# Patient Record
Sex: Female | Born: 1945
Health system: Southern US, Community
[De-identification: ages and names within clinical notes are randomized; demographics above are authoritative.]

## PROBLEM LIST (undated history)

## (undated) DIAGNOSIS — E119 Type 2 diabetes mellitus without complications: Secondary | ICD-10-CM

## (undated) DIAGNOSIS — E079 Disorder of thyroid, unspecified: Secondary | ICD-10-CM

## (undated) DIAGNOSIS — M8580 Other specified disorders of bone density and structure, unspecified site: Secondary | ICD-10-CM

## (undated) DIAGNOSIS — N200 Calculus of kidney: Secondary | ICD-10-CM

## (undated) DIAGNOSIS — K5790 Diverticulosis of intestine, part unspecified, without perforation or abscess without bleeding: Secondary | ICD-10-CM

## (undated) DIAGNOSIS — Z860100 Personal history of colon polyps, unspecified: Secondary | ICD-10-CM

## (undated) DIAGNOSIS — R05 Cough: Secondary | ICD-10-CM

## (undated) DIAGNOSIS — E039 Hypothyroidism, unspecified: Secondary | ICD-10-CM

## (undated) DIAGNOSIS — Z9889 Other specified postprocedural states: Secondary | ICD-10-CM

## (undated) DIAGNOSIS — R059 Cough, unspecified: Secondary | ICD-10-CM

## (undated) DIAGNOSIS — I10 Essential (primary) hypertension: Secondary | ICD-10-CM

## (undated) DIAGNOSIS — R112 Nausea with vomiting, unspecified: Secondary | ICD-10-CM

## (undated) DIAGNOSIS — R609 Edema, unspecified: Secondary | ICD-10-CM

## (undated) DIAGNOSIS — C44310 Basal cell carcinoma of skin of unspecified parts of face: Secondary | ICD-10-CM

## (undated) DIAGNOSIS — E559 Vitamin D deficiency, unspecified: Secondary | ICD-10-CM

## (undated) DIAGNOSIS — K8020 Calculus of gallbladder without cholecystitis without obstruction: Secondary | ICD-10-CM

## (undated) DIAGNOSIS — J449 Chronic obstructive pulmonary disease, unspecified: Secondary | ICD-10-CM

## (undated) DIAGNOSIS — E785 Hyperlipidemia, unspecified: Secondary | ICD-10-CM

## (undated) DIAGNOSIS — K219 Gastro-esophageal reflux disease without esophagitis: Secondary | ICD-10-CM

## (undated) HISTORY — DX: Gastro-esophageal reflux disease without esophagitis: K21.9

## (undated) HISTORY — DX: Vitamin D deficiency, unspecified: E55.9

## (undated) HISTORY — DX: Type 2 diabetes mellitus without complications: E11.9

## (undated) HISTORY — DX: Cough, unspecified: R05.9

## (undated) HISTORY — DX: Personal history of colonic polyps: Z86.010

## (undated) HISTORY — DX: Diverticulosis of intestine, part unspecified, without perforation or abscess without bleeding: K57.90

## (undated) HISTORY — PX: CATARACT EXTRACTION, BILATERAL: SHX1313

## (undated) HISTORY — DX: Disorder of thyroid, unspecified: E07.9

## (undated) HISTORY — PX: MOHS SURGERY: SUR867

## (undated) HISTORY — DX: Calculus of kidney: N20.0

## (undated) HISTORY — DX: Other specified disorders of bone density and structure, unspecified site: M85.80

## (undated) HISTORY — PX: VAGINAL HYSTERECTOMY: SUR661

## (undated) HISTORY — DX: Cough: R05

## (undated) HISTORY — DX: Edema, unspecified: R60.9

## (undated) HISTORY — DX: Basal cell carcinoma of skin of unspecified parts of face: C44.310

## (undated) HISTORY — DX: Hyperlipidemia, unspecified: E78.5

## (undated) HISTORY — PX: PARATHYROIDECTOMY: SHX19

## (undated) HISTORY — DX: Personal history of colon polyps, unspecified: Z86.0100

## (undated) HISTORY — DX: Calculus of gallbladder without cholecystitis without obstruction: K80.20

---

## 1999-11-11 ENCOUNTER — Encounter: Admission: RE | Admit: 1999-11-11 | Discharge: 1999-11-11 | Payer: Self-pay | Admitting: Family Medicine

## 1999-11-11 ENCOUNTER — Encounter: Payer: Self-pay | Admitting: Family Medicine

## 1999-11-17 ENCOUNTER — Ambulatory Visit (HOSPITAL_COMMUNITY): Admission: RE | Admit: 1999-11-17 | Discharge: 1999-11-17 | Payer: Self-pay

## 1999-12-07 ENCOUNTER — Ambulatory Visit (HOSPITAL_COMMUNITY): Admission: RE | Admit: 1999-12-07 | Discharge: 1999-12-07 | Payer: Self-pay

## 2000-09-06 ENCOUNTER — Encounter: Payer: Self-pay | Admitting: Family Medicine

## 2000-09-06 ENCOUNTER — Encounter: Admission: RE | Admit: 2000-09-06 | Discharge: 2000-09-06 | Payer: Self-pay | Admitting: Family Medicine

## 2001-06-06 ENCOUNTER — Emergency Department (HOSPITAL_COMMUNITY): Admission: EM | Admit: 2001-06-06 | Discharge: 2001-06-06 | Payer: Self-pay | Admitting: Emergency Medicine

## 2002-12-27 ENCOUNTER — Other Ambulatory Visit: Admission: RE | Admit: 2002-12-27 | Discharge: 2002-12-27 | Payer: Self-pay | Admitting: Family Medicine

## 2003-07-06 ENCOUNTER — Emergency Department (HOSPITAL_COMMUNITY): Admission: EM | Admit: 2003-07-06 | Discharge: 2003-07-07 | Payer: Self-pay | Admitting: Emergency Medicine

## 2004-02-04 ENCOUNTER — Encounter: Admission: RE | Admit: 2004-02-04 | Discharge: 2004-02-04 | Payer: Self-pay | Admitting: Family Medicine

## 2005-03-11 ENCOUNTER — Encounter: Admission: RE | Admit: 2005-03-11 | Discharge: 2005-03-11 | Payer: Self-pay | Admitting: Family Medicine

## 2006-03-21 ENCOUNTER — Encounter: Admission: RE | Admit: 2006-03-21 | Discharge: 2006-03-21 | Payer: Self-pay | Admitting: Family Medicine

## 2006-04-11 DIAGNOSIS — M8580 Other specified disorders of bone density and structure, unspecified site: Secondary | ICD-10-CM

## 2006-04-11 HISTORY — DX: Other specified disorders of bone density and structure, unspecified site: M85.80

## 2007-03-30 ENCOUNTER — Encounter: Admission: RE | Admit: 2007-03-30 | Discharge: 2007-03-30 | Payer: Self-pay | Admitting: Family Medicine

## 2007-04-27 ENCOUNTER — Ambulatory Visit: Payer: Self-pay | Admitting: Internal Medicine

## 2007-05-11 ENCOUNTER — Ambulatory Visit: Payer: Self-pay | Admitting: Internal Medicine

## 2007-05-11 ENCOUNTER — Encounter: Payer: Self-pay | Admitting: Internal Medicine

## 2008-04-16 ENCOUNTER — Other Ambulatory Visit: Admission: RE | Admit: 2008-04-16 | Discharge: 2008-04-16 | Payer: Self-pay | Admitting: Otolaryngology

## 2008-04-23 ENCOUNTER — Encounter: Admission: RE | Admit: 2008-04-23 | Discharge: 2008-04-23 | Payer: Self-pay | Admitting: Family Medicine

## 2008-04-29 ENCOUNTER — Encounter: Admission: RE | Admit: 2008-04-29 | Discharge: 2008-04-29 | Payer: Self-pay | Admitting: Family Medicine

## 2008-11-17 ENCOUNTER — Encounter: Admission: RE | Admit: 2008-11-17 | Discharge: 2008-11-17 | Payer: Self-pay | Admitting: Internal Medicine

## 2009-05-06 ENCOUNTER — Encounter: Admission: RE | Admit: 2009-05-06 | Discharge: 2009-05-06 | Payer: Self-pay | Admitting: Internal Medicine

## 2010-04-11 HISTORY — PX: CHOLECYSTECTOMY: SHX55

## 2010-05-27 ENCOUNTER — Encounter: Payer: Self-pay | Admitting: Internal Medicine

## 2010-06-02 NOTE — Letter (Signed)
Summary: Colonoscopy Letter  [facility]  [address] [place], [place] [address]   Phone: [phone]  Fax: [phone]      May 27, 2010 MRN: [id number]   VEE BAHE [address] [place], [place]  [address]   Dear Ms. LABRUM,   According to your medical record, it is time for you to schedule a Colonoscopy. The American Cancer Society recommends this procedure as a method to detect early colon cancer. Patients with a family history of colon cancer, or a personal history of colon polyps or inflammatory bowel disease are at increased risk.  This letter has been generated based on the recommendations made at the time of your procedure. If you feel that in your particular situation this may no longer apply, please contact our office.  Please call our office at [phone] to schedule this appointment or to update your records at your earliest convenience.  Thank you for cooperating with [place] to provide you with the very best care possible.   Sincerely,  Hedwig Morton. Juanda Chance, M.D.  [facility] [phone]

## 2010-06-18 ENCOUNTER — Emergency Department (HOSPITAL_COMMUNITY): Payer: Commercial Managed Care - PPO

## 2010-06-18 ENCOUNTER — Observation Stay (HOSPITAL_COMMUNITY)
Admission: EM | Admit: 2010-06-18 | Discharge: 2010-06-19 | Disposition: A | Discharge status: Home / Self Care | Payer: Commercial Managed Care - PPO | Attending: Internal Medicine | Admitting: Internal Medicine

## 2010-06-18 ENCOUNTER — Encounter (HOSPITAL_COMMUNITY): Payer: Self-pay | Admitting: Radiology

## 2010-06-18 DIAGNOSIS — E279 Disorder of adrenal gland, unspecified: Secondary | ICD-10-CM | POA: Insufficient documentation

## 2010-06-18 DIAGNOSIS — K7689 Other specified diseases of liver: Secondary | ICD-10-CM | POA: Insufficient documentation

## 2010-06-18 DIAGNOSIS — K219 Gastro-esophageal reflux disease without esophagitis: Principal | ICD-10-CM | POA: Insufficient documentation

## 2010-06-18 DIAGNOSIS — Z79899 Other long term (current) drug therapy: Secondary | ICD-10-CM | POA: Insufficient documentation

## 2010-06-18 DIAGNOSIS — J984 Other disorders of lung: Secondary | ICD-10-CM | POA: Insufficient documentation

## 2010-06-18 DIAGNOSIS — E039 Hypothyroidism, unspecified: Secondary | ICD-10-CM | POA: Insufficient documentation

## 2010-06-18 DIAGNOSIS — K8020 Calculus of gallbladder without cholecystitis without obstruction: Secondary | ICD-10-CM | POA: Insufficient documentation

## 2010-06-18 DIAGNOSIS — E214 Other specified disorders of parathyroid gland: Secondary | ICD-10-CM | POA: Insufficient documentation

## 2010-06-18 DIAGNOSIS — F17200 Nicotine dependence, unspecified, uncomplicated: Secondary | ICD-10-CM | POA: Insufficient documentation

## 2010-06-18 DIAGNOSIS — R072 Precordial pain: Secondary | ICD-10-CM

## 2010-06-18 DIAGNOSIS — I2510 Atherosclerotic heart disease of native coronary artery without angina pectoris: Secondary | ICD-10-CM | POA: Insufficient documentation

## 2010-06-18 DIAGNOSIS — I440 Atrioventricular block, first degree: Secondary | ICD-10-CM | POA: Insufficient documentation

## 2010-06-18 DIAGNOSIS — Z90710 Acquired absence of both cervix and uterus: Secondary | ICD-10-CM | POA: Insufficient documentation

## 2010-06-18 HISTORY — DX: Essential (primary) hypertension: I10

## 2010-06-18 LAB — DIFFERENTIAL
Basophils Absolute: 0 10*3/uL (ref 0.0–0.1)
Eosinophils Relative: 1 % (ref 0–5)
Lymphocytes Relative: 15 % (ref 12–46)
Lymphs Abs: 1.8 10*3/uL (ref 0.7–4.0)
Monocytes Absolute: 0.6 10*3/uL (ref 0.1–1.0)
Monocytes Relative: 5 % (ref 3–12)
Neutro Abs: 9.5 10*3/uL — ABNORMAL HIGH (ref 1.7–7.7)

## 2010-06-18 LAB — BASIC METABOLIC PANEL
BUN: 13 mg/dL (ref 6–23)
Chloride: 102 mEq/L (ref 96–112)
Creatinine, Ser: 1.01 mg/dL (ref 0.4–1.2)
GFR calc non Af Amer: 55 mL/min — ABNORMAL LOW (ref >60)
Glucose, Bld: 139 mg/dL — ABNORMAL HIGH (ref 70–99)

## 2010-06-18 LAB — CARDIAC PANEL(CRET KIN+CKTOT+MB+TROPI)
CK, MB: 1.3 ng/mL (ref 0.3–4.0)
CK, MB: 1.8 ng/mL (ref 0.3–4.0)
Total CK: 69 U/L (ref 7–177)
Total CK: 80 U/L (ref 7–177)
Troponin I: <0.01 ng/mL (ref 0.00–0.06)
Troponin I: 0.01 ng/mL (ref 0.00–0.06)

## 2010-06-18 LAB — POCT CARDIAC MARKERS
CKMB, poc: <1 ng/mL — ABNORMAL LOW (ref 1.0–8.0)
Troponin i, poc: <0.05 ng/mL (ref 0.00–0.09)

## 2010-06-18 LAB — CBC: Platelets: 242 10*3/uL (ref 150–400)

## 2010-06-18 MED ORDER — IOHEXOL 300 MG/ML  SOLN
80.0000 mL | Freq: Once | INTRAMUSCULAR | Status: AC | PRN
  Administered 2010-06-18: 80 mL via INTRAVENOUS

## 2010-06-19 LAB — CARDIAC PANEL(CRET KIN+CKTOT+MB+TROPI)
Relative Index: INVALID (ref 0.0–2.5)
Total CK: 87 U/L (ref 7–177)
Troponin I: 0.01 ng/mL (ref 0.00–0.06)

## 2010-06-30 NOTE — Discharge Summary (Signed)
NAME:  Erika Mason, Erika Mason            ACCOUNT NO.:  [id number]  MEDICAL RECORD NO.:  [id number]           PATIENT TYPE:  I  LOCATION:  1437                         FACILITY:  [facility]  PHYSICIAN:  Baltazar Najjar, MD     DATE OF BIRTH:  01-Jun-1945  DATE OF ADMISSION:  06/18/2010 DATE OF DISCHARGE:  06/19/2010                              DISCHARGE SUMMARY   FINAL DISCHARGE DIAGNOSES: 1. Atypical chest pain probably secondary to gastroesophageal reflux     disease. 2. Gastroesophageal reflux disease 3. Hypothyroidism. 4. Lung nodules. 5. Bilateral adrenal masses, probably adenomas 6. Incidental finding of gallstones with gallbladder thickening with     no evidence of cholecystitis clinically.  RADIOLOGY/IMAGING: 1. CT scan of the chest with contrast showed coronary artery     atherosclerosis, also incidentally showed 6-mm nodule in the     lingula with 4-mm nodule in the right lower lobe.  Recommendation     for repeat CT scan in 6-12 months recommended by Radiology.     Bilateral adrenal masses probably likely to be benign adenomas and     also they recommended noncontrast CT scan at the time of followup     for long nodules.  Gallstones with gallbladder wall thickening,     correlate clinically.  Hepatic steatosis. 2. A 2-D echocardiogram showed EF of 65-70%, vigorous left ventricular     systolic function and finding suggestive of grade 1 diastolic     dysfunction. 3. Chest x-ray showed mild hyperinflation, no acute finding.  BRIEF ADMITTING HISTORY:  Please refer to the H and P for more details. In summary, [name] is a 65 year old woman, heavy smoker, with history of GERD and the above history who presented to the ER with chest pain which she described as acid reflux on the right side of her chest similar to her previous episodes of GERD exacerbation, improving with GI cocktail in the ED, no change with nitroglycerin.  HOSPITAL COURSE:  The patient was admitted  to the telemetry floor.  Her troponins were cycled, were unremarkable.  Her symptoms improved with GI cocktail and increasing on her Protonix dose suggesting GI source rather than a cardiac source.  She had a 2-D echocardiogram as above and remained stable. 1. GERD.  PPI increased to twice daily.  I would recommend referral to     GI as an outpatient and defer that to her PCP. 2. Incidental finding of lung nodules.  CT scan was ordered because     the patient stated that she had 2 episodes of bloody sputum which     she described as small amount; however, given her chronic heavy     smoking history, CT scan was warranted.  Results as above.  She     will need repeat CT scan in 6 months, which we will defer to PCP. 3. Incidental finding of bilateral adrenal masses, probably adenomas.     Further workup as an outpatient per PCP and Radiology recommended     repeat noncontrast CT versus MRI to further characterize those     findings. 4. Tobacco abuse.  The patient was  counseled and placed on nicotine     patch, and I will give her prescription as well to go home with. 5. Hypothyroidism.  Continue with levothyroxine. 6. Gallbladder stones.  The patient is asymptomatic.  Follow up as an     outpatient.  The patient seen and examined by me today, and she is stable for discharge home.  DISCHARGE MEDICATIONS: 1. Aspirin 81 mg enteric coated p.o. daily. 2. Nicotine patch 21 mg daily. 3. Protonix 40 mg p.o. b.i.d. 4. Lipitor 40 mg p.o. daily at bedtime. 5. Hydrochlorothiazide 25 mg p.o. q.a.m. 6. Levothyroxine 88 mcg p.o. q.a.m. 7. Systane restorative formula 0.6% 2 drops in both eyes 4 times     daily.  DISCHARGE INSTRUCTIONS: 1. The patient to continue above medications as prescribed. 2. The patient to follow with her PCP preferably this coming next week     to discuss above findings and to arrange for followup for repeat     imaging in 6 months for her lung nodules and imaging for  her     adrenal glands as well. 3. The patient to continue with nicotine patch and counseled to quit     smoking. 4. The patient to report any worsening of her symptoms or any new     symptoms to her PCP, and I will defer decision to send her to GI     for EGD to her PCP if needed if her symptoms persist. 5. The patient is stable for discharge to home today.  CONDITION ON DISCHARGE:  Stable, improved.          ______________________________ Baltazar Najjar, MD     SA/MEDQ  D:  06/19/2010  T:  06/19/2010  Job:  [id number]  cc:   Tessa Lerner. Chelle, PA-C  Electronically Signed by Hannah Beat MD on 06/30/2010 07:43:19 PM

## 2010-06-30 NOTE — H&P (Signed)
NAME:  Erika Mason, Erika Mason NO.:  [id number]  MEDICAL RECORD NO.:  [id number]           PATIENT TYPE:  E  LOCATION:  WLED                         FACILITY:  [facility]  PHYSICIAN:  Baltazar Najjar, MD     DATE OF BIRTH:  [birth year]  DATE OF ADMISSION:  06/18/2010 DATE OF DISCHARGE:                             HISTORY & PHYSICAL   PRIMARY CARE PHYSICIAN:  Dr. Porfirio Oar at [facility].  CODE STATUS:  The patient is a full code.  CHIEF COMPLAINT:  Chest pain.  HISTORY OF PRESENT ILLNESS:  [name] is a 65 year old woman with a below-dictated medical history, presented to the ER this morning complaining of right-sided chest pain on the right side of her sternum, started early this morning.  She described her pain as burning pain, like acid pain as per her ward.  Denies any radiation.  She was able to pinpoint to the location of the pain on the right side of the sternum associated with vomiting.  She vomited twice this morning and felt clammy for few seconds.  As per her, she had similar pains for the last 7 years on and off; however, she had this pain 3 times within the last week.  She admitted that she had history of GERD and stated that her current symptoms are exactly the same as her previous care presentations.  The patient also has history of chronic cough which usually is productive of clear sputum.  However as per her last week, she had 2 episodes where she had bloody sputum and she described it as small amount of blood in her sputum.  She does smoke cigarettes for the last 15 years, about 15 cigarettes a day.  She denies any associated fever or shortness of breath.  PAST MEDICAL HISTORY: 1. Hypothyroidism. 2. GERD.  PAST SURGICAL HISTORY:  Hysterectomy, parathyroidectomy.  SOCIAL HISTORY:  Lives at home.  Her brother lives with her.  Smoke cigarettes, about 15 cigarettes a day for the last 15 years.  Denies EtOH  drinking or illicit drug use.  ALLERGIES:  No known drug allergies.  HOME MEDICATIONS: 1. Hydrochlorothiazide 25 mg daily which she states she takes for     fluid retention. 2. Levothyroxine 88 mcg daily. 3. Protonix 40 mg daily. 4. Atorvastatin 40 mg daily.  REVIEW OF SYSTEMS:  As stated above in HPI.  CHEST:  Complaining of cough with 2 episodes of blood in sputum.  CARDIOVASCULAR:  Chest pain, resolved with GI cocktail in the ED.  GI:  Vomiting resolved.  No abdominal pain.  No change in bowel habits.  GU:  No dysuria.  No flank pain or urinary frequency.  CONSTITUTIONAL:  No fever or chills.  PHYSICAL EXAMINATION:  VITAL SIGNS:  Blood pressure 125/68, pulse 82, temperature 97.7, O2 sat 98% on room air. GENERAL:  She is alert, not in distress. NECK:  Supple.  No JVD. LUNGS:  Clear to auscultation bilaterally. CARDIOVASCULAR:  S1, S2.  Regular rhythm and rate. ABDOMEN:  Soft, nontender. EXTREMITIES:  No pedal edema. NEUROLOGIC:  No focal deficits appreciated.  LABORATORY DATA:  Troponin less than  0.05, next set 0.05 x 2, potassium 3.7, BUN 15, creatinine 1.01, GFR 55.  WBC 12, hemoglobin 14, hematocrit 41, platelet count 242,000.  EKG showed normal sinus rhythm with no ST-T wave changes.  Chest x-ray showed mild hyperinflation.  No acute findings.  ASSESSMENT AND PLAN:  [name] is a 65 year old woman with no real cardiac risk factor other than being a smoker, presented to the ER with chest pain that was relieved by the GI cocktail in the ED.  No relief with nitro as per the ER notes.  Exactly the same to her previous ER presentation as per her ward.  Pain currently resolved. 1. Chest pain, sounds to be atypical, probably secondary to     gastroesophageal reflux disease.  We will admit the patient to     telemetry to cycle cardiac enzymes to rule out any possibility of     cardiac source which is unlikely to me. 2. Continue with proton pump inhibitor.  I will  increase her Protonix     to twice a day. 3. The patient will probably benefit from GI evaluation which can be     done as an outpatient for possible scope esophagogastroduodenoscopy     to rule out any esophagitis or any other possible GI pathology. 4. Will put her on a baby aspirin, enteric-coated, for now.  Will     order 2-D echocardiogram. 5. Tobacco abuse/bloody sputum.  The patient counseled extensively on     smoking cessation. 6. I will place her on nicotine patch. 7. I will order CT scan of her chest to rule out any tumors given the     fact that she is a chronically heavy smoker and the presence of 2     episodes of bloody sputum. 8. Hypothyroidism.  Continue with levothyroxine.  Check TSH. 9. GI and DVT prophylaxis.  Continue with her Protonix and will also     place her on heparin subcu. 10.Code status.  The patient is full code.          ______________________________ Baltazar Najjar, MD     SA/MEDQ  D:  06/18/2010  T:  06/18/2010  Job:  [id number]  cc:   Dr. Porfirio Oar at [facility]  Electronically Signed by Hannah Beat MD on 06/30/2010 07:43:46 PM

## 2010-08-11 ENCOUNTER — Ambulatory Visit
Admission: RE | Admit: 2010-08-11 | Discharge: 2010-08-11 | Disposition: A | Discharge status: Home / Self Care | Payer: Commercial Managed Care - PPO | Source: Ambulatory Visit | Attending: Family Medicine | Admitting: Family Medicine

## 2010-08-11 ENCOUNTER — Other Ambulatory Visit: Payer: Self-pay | Admitting: Family Medicine

## 2010-08-11 DIAGNOSIS — R1011 Right upper quadrant pain: Secondary | ICD-10-CM

## 2010-08-11 DIAGNOSIS — R112 Nausea with vomiting, unspecified: Secondary | ICD-10-CM

## 2010-08-13 ENCOUNTER — Telehealth: Payer: Self-pay | Admitting: Internal Medicine

## 2010-08-13 DIAGNOSIS — Z1211 Encounter for screening for malignant neoplasm of colon: Secondary | ICD-10-CM

## 2010-08-13 NOTE — Telephone Encounter (Signed)
Pt states that she would like to schedule an endo and colon. She is due for a colon but pt states she is having problems with reflux. Pt wants to make sure that the acid has not damaged her esophagus. Dr. Juanda Chance does she need direct procedures scheduled or would she need an OV first? Please advise.

## 2010-08-14 NOTE — Telephone Encounter (Signed)
Chart reviewed, please, schedule direct EGD/colon

## 2010-08-16 NOTE — Telephone Encounter (Signed)
Scheduled pt for procedures, have left message for pt to call back.

## 2010-08-16 NOTE — Telephone Encounter (Signed)
Pt scheduled for previsit for 08/23/10@4 :30pm and endo/colon 09/03/10@3 :30pm. Pt aware of appt dates and times.

## 2010-08-23 ENCOUNTER — Ambulatory Visit (AMBULATORY_SURGERY_CENTER): Payer: Commercial Managed Care - PPO | Admitting: *Deleted

## 2010-08-23 VITALS — Ht 63.0 in | Wt 158.2 lb

## 2010-08-23 DIAGNOSIS — Z860100 Personal history of colon polyps, unspecified: Secondary | ICD-10-CM

## 2010-08-23 DIAGNOSIS — K219 Gastro-esophageal reflux disease without esophagitis: Secondary | ICD-10-CM

## 2010-08-23 MED ORDER — PEG-KCL-NACL-NASULF-NA ASC-C 100 G PO SOLR: ORAL | Status: DC

## 2010-08-27 NOTE — Op Note (Signed)
[facility]  Patient:    Erika Mason, Erika Mason                   MRN: [id number] Proc. Date: 12/07/99 Adm. Date:  [id number] Attending:  Gennie Alma CC:         Quita Skye. Artis Flock, M.D.   Operative Report  [facility] [place] NUMBER:  [id number]  PREOPERATIVE DIAGNOSIS:  Parathyroid adenoma of left inferior gland.  POSTOPERATIVE DIAGNOSIS:  Parathyroid adenoma of left inferior gland.  OPERATION:  Minimally invasive radio-guided parathyroid adenoma resection.  SURGEON:  Milus Mallick, M.D.  ASSISTANT:  Adolph Pollack, M.D.  ANESTHESIA:  General endotracheal.  DESCRIPTION OF PROCEDURE:  Under adequate general endotracheal anesthesia, the patients neck was extended over a shoulder roll and then prepared and draped in the usual fashion.  Nuclear mapping of the neck revealed a very hot area in the left inferior position.  The patient had received technetium sestamibi agent intravenously approximately 90 minutes prior to surgery.  She had a strongly positive sestamibi scan that revealed a probable parathyroid adenoma of the left inferior gland.  A transverse small incision was made overlying the area of increased radioactivity.  The incision was carried down through the subcutaneous tissue and the platysma muscle.  Soft platysmal flaps were dissected.  The anterior jugular vein was encountered and divided over hemostats, which were ligated with 4-0 silk.  The strap muscles were then divided and the surgical plane of the thyroid was entered.  The anterior pole of the thyroid was just under the incision.  It had increased counts of radioactivity, but it was thought that they were probably coming from tissue placed nearer to the thyroid lobe.  The left thyroid lobe was retracted medially and anteriorly and almost immediately a large parathyroid adenoma was begun to be seen.  It was adjacent to the left carotid artery and extending down into the  anterior superior mediastinum.  The probe confirmed that this was the area of highest activity.  The adenoma was then dissected out by blunt and sharp dissection.  Its blood supply was divided over small hemoclips. Most of the dissection was done with mosquito hemostats and Kitner dissectors. The entire lesion was freed up and completely excised.  The ex vivo counts were in the range of 1400 counts/sec with background being 400-500.  It was measured and found to be 2 cm in length, 1.2 cm in width, and 0.8 cm in thickness.  It was sent for routine pathologic study.  The counts of radioactivity in the four quadrants now equalized at around 400 counts/sec.  Hemostasis was ascertained.  The strap muscles were repaired with interrupted sutures of 4-0 Vicryl.  The platysma was reapproximated with interrupted sutures of 4-0 Vicryl.  The subcuticular layer was reapproximated with a continuous suture of 5-0 Vicryl and then Steri-Strips were applied to the skin.  A sterile dressing was applied.  The estimated blood loss for the procedure was negligible.  The patient tolerated the procedure well and left the operating room in satisfactory condition. DD:  12/07/99 TD:  12/07/99 Job: [id number]

## 2010-09-03 ENCOUNTER — Encounter: Payer: Self-pay | Admitting: Internal Medicine

## 2010-09-03 ENCOUNTER — Ambulatory Visit (AMBULATORY_SURGERY_CENTER): Payer: Commercial Managed Care - PPO | Admitting: Internal Medicine

## 2010-09-03 VITALS — BP 123/63 | HR 86 | Temp 98.0°F | Resp 18 | Ht 63.0 in | Wt 158.3 lb

## 2010-09-03 DIAGNOSIS — K219 Gastro-esophageal reflux disease without esophagitis: Secondary | ICD-10-CM

## 2010-09-03 DIAGNOSIS — D126 Benign neoplasm of colon, unspecified: Secondary | ICD-10-CM

## 2010-09-03 DIAGNOSIS — K2270 Barrett's esophagus without dysplasia: Secondary | ICD-10-CM

## 2010-09-03 DIAGNOSIS — Z1211 Encounter for screening for malignant neoplasm of colon: Secondary | ICD-10-CM

## 2010-09-03 DIAGNOSIS — Z86010 Personal history of colonic polyps: Secondary | ICD-10-CM

## 2010-09-03 MED ORDER — PANTOPRAZOLE SODIUM 40 MG PO TBEC: 40.0000 mg | DELAYED_RELEASE_TABLET | Freq: Two times a day (BID) | ORAL | Status: DC

## 2010-09-03 MED ORDER — SODIUM CHLORIDE 0.9 % IV SOLN: 500.0000 mL | INTRAVENOUS | Status: DC

## 2010-09-03 NOTE — Patient Instructions (Addendum)
PLEASE FOLLOW DISCHARGE INSTRUCTIONS GIVEN TODAY. RESULT LETTER WILL BE MAILED TO YOU IN 1-2 WEEKS. CALL us WITH ANY QUESTIONS OR CONCERNS. WE WILL CALL YOU Tuesday MORNING TO CHECK ON YOU. RESUME YOUR CURRENT MEDICATIONS. REPEAT COLONOSCOPY IN 3-5 YEARS. NO ASPIRIN FOR 2 WEEKS! TRY TO FOLLOW HIGH FIBER DIET,SEE HANDOUT GIVEN. TAKE PROTONIX  TWICE A DAY.

## 2010-09-07 ENCOUNTER — Telehealth: Payer: Self-pay | Admitting: *Deleted

## 2010-09-07 NOTE — Telephone Encounter (Signed)

## 2010-09-14 ENCOUNTER — Encounter: Payer: Self-pay | Admitting: Internal Medicine

## 2010-10-04 ENCOUNTER — Telehealth (INDEPENDENT_AMBULATORY_CARE_PROVIDER_SITE_OTHER): Payer: Self-pay | Admitting: General Surgery

## 2010-10-04 NOTE — Telephone Encounter (Signed)
Called pt and gave diet instructions for patient with gallstones.

## 2010-10-11 ENCOUNTER — Ambulatory Visit (INDEPENDENT_AMBULATORY_CARE_PROVIDER_SITE_OTHER): Payer: Self-pay | Admitting: General Surgery

## 2010-10-19 ENCOUNTER — Ambulatory Visit (INDEPENDENT_AMBULATORY_CARE_PROVIDER_SITE_OTHER): Payer: Commercial Managed Care - PPO | Admitting: General Surgery

## 2010-10-19 ENCOUNTER — Encounter (INDEPENDENT_AMBULATORY_CARE_PROVIDER_SITE_OTHER): Payer: Self-pay | Admitting: General Surgery

## 2010-10-19 VITALS — BP 122/78 | HR 95 | Ht 63.5 in | Wt 156.6 lb

## 2010-10-19 DIAGNOSIS — K8020 Calculus of gallbladder without cholecystitis without obstruction: Secondary | ICD-10-CM

## 2010-10-19 NOTE — Progress Notes (Signed)
Subjective:     Patient ID: Erika Mason, female   DOB: 09-27-1945, 65 y.o.   MRN: [id number]    BP 122/78  Pulse 95  Ht 5' 3.5" (1.613 m)  Wt 156 lb 9.6 oz (71.033 kg)  BMI 27.31 kg/m2    HPI This patient was referred for evaluation of right quadrant abdominal pain and cholelithiasis discovered on ultrasound of the abdomen performed in May of 2012. She has had a long standing history of abdominal pain which she first noticed approximately 5-6 years ago and her symptoms occur approximately once per year over this time period. However, they have become more frequent over the last year occurring up to 2 times per week but she states that they have improved since she has been "watching what she eats". She denies any specific fatty food association although she does notice this were frequently with spaghetti and her most recent attack was after eating a hot dog. She describes the pain as originating in the right upper quadrant and radiates around to the back and she states it feels like "pressure" squeezing her from the front to back. The episodes occur most commonly in the morning at about 3:30 in the morning which awakened her from sleep the last several hours. She has associated nausea and occasional vomiting which will often relieve her symptoms. She was recently seen in the emergency room for the symptoms in May 2012 and had ultrasound obtained which demonstrated cholelithiasis with some gallbladder wall thickening but no other evidence of acute cholecystitis common bile duct was 4.5 mm. She also has had an EGD recently which demonstrated Barrett esophagus but no evidence of masses or ulcers she has also had a colonoscopy which was negative for any significant disease. Pathology was consistent with hyperplastic polyps in her colon. She was told to followup in 2 years for evaluation of her Barrett's esophagus by her GI physician. She denies any jaundice or fevers.  No Known Allergies  Past  Medical History  Diagnosis Date  . Hypertension   . Hyperlipidemia   . Thyroid disease   . GERD (gastroesophageal reflux disease)   . Cough    Past Surgical History  Procedure Date  . Vaginal hysterectomy   . Parathyroidectomy   . Cataract extraction, bilateral     Current outpatient prescriptions:atorvastatin (LIPITOR) 40 MG tablet, Take 40 mg by mouth daily. , Disp: , Rfl: ;  hydrochlorothiazide 25 MG tablet, Take 25 mg by mouth daily.  , Disp: , Rfl: ;  levothyroxine (SYNTHROID, LEVOTHROID) 88 MCG tablet, Take 88 mcg by mouth daily.  , Disp: , Rfl: ;  pantoprazole (PROTONIX) 40 MG tablet, Take 1 tablet (40 mg total) by mouth 2 (two) times daily., Disp: 180 tablet, Rfl: 3 amLODipine-atorvastatin (CADUET) 5-40 MG per tablet, Take 1 tablet by mouth daily.  , Disp: , Rfl: ;  DISCONTD: pantoprazole (PROTONIX) 40 MG tablet, Take 40 mg by mouth daily.  , Disp: , Rfl:  Current facility-administered medications:0.9 %  sodium chloride infusion, 500 mL, Intravenous, Continuous, Hart Carwin, MD History   Social History  . Marital Status: Single    Spouse Name: N/A    Number of Children: N/A  . Years of Education: N/A   Social History Main Topics  . Smoking status: Current Everyday Smoker -- 0.8 packs/day    Types: Cigarettes  . Smokeless tobacco: None  . Alcohol Use: No  . Drug Use: No  . Sexually Active: None   Other Topics  Concern  . None   Social History Narrative  . None     Review of Systems  Constitutional: Negative.   HENT: Positive for hearing loss.   Eyes: Negative.   Respiratory: Positive for cough.   Cardiovascular: Negative.   Gastrointestinal: Positive for nausea, vomiting and abdominal pain.  Genitourinary: Negative.   Musculoskeletal: Negative.   Skin: Negative.   Neurological: Negative.   Hematological: Negative.   Psychiatric/Behavioral: Negative.   All other systems reviewed and are negative.        Objective:   Physical Exam  Constitutional:  She is oriented to person, place, and time. She appears well-developed and well-nourished. No distress.  HENT:  Head: Normocephalic and atraumatic.  Mouth/Throat: No oropharyngeal exudate.  Eyes: Conjunctivae are normal. Pupils are equal, round, and reactive to light. Right eye exhibits no discharge. Left eye exhibits no discharge. No scleral icterus.  Neck: Normal range of motion. Neck supple. No tracheal deviation present.  Cardiovascular: Normal rate, regular rhythm, normal heart sounds and intact distal pulses.   Pulmonary/Chest: Effort normal and breath sounds normal. No stridor. No respiratory distress. She has no wheezes. She has no rales.  Abdominal: Soft. Bowel sounds are normal. She exhibits no distension and no mass. There is no tenderness. There is no rebound and no guarding.  Musculoskeletal: Normal range of motion. She exhibits no edema.  Neurological: She is alert and oriented to person, place, and time. She has normal reflexes.  Skin: Skin is warm and dry. No rash noted. She is not diaphoretic. No erythema.  Psychiatric: She has a normal mood and affect. Her behavior is normal. Judgment and thought content normal.       Assessment:     Symptomatic cholelithiasis    Plan:     I agree that this is most likely due to symptomatic cholelithiasis. She has had long-standing history of abdominal pain and given her history which is fairly classic for symptomatic gallstones as well as an ultrasound consistent with cholelithiasis I have recommended cholecystectomy for possible relief of her symptoms. She has also had upper and lower endoscopy and is currently on PPIs for reflux disease and this has not modified her symptoms. I discussed with her the alternatives of continued observation versus cholecystectomy including laparoscopic and open procedure and she desires to proceed with appendectomy cholecystectomy. We discussed the risks of infection, bleeding, pain, scarring, persistent  symptoms, bowel injury, injury to common bile duct, retained stone, and need for open surgery and she expressed understanding. We will plan for lap appendectomy cholecystectomy at the next available appointment.

## 2010-10-28 ENCOUNTER — Encounter (HOSPITAL_COMMUNITY): Payer: Commercial Managed Care - PPO

## 2010-10-28 ENCOUNTER — Other Ambulatory Visit (INDEPENDENT_AMBULATORY_CARE_PROVIDER_SITE_OTHER): Payer: Self-pay | Admitting: General Surgery

## 2010-10-28 LAB — CBC
Hemoglobin: 14.6 g/dL (ref 12.0–15.0)
MCH: 30.9 pg (ref 26.0–34.0)
MCHC: 34 g/dL (ref 30.0–36.0)
MCV: 90.7 fL (ref 78.0–100.0)

## 2010-10-28 LAB — COMPREHENSIVE METABOLIC PANEL
ALT: 23 U/L (ref 0–35)
AST: 17 U/L (ref 0–37)
Albumin: 4.1 g/dL (ref 3.5–5.2)
Alkaline Phosphatase: 115 U/L (ref 39–117)
Chloride: 101 mEq/L (ref 96–112)
Creatinine, Ser: 0.84 mg/dL (ref 0.50–1.10)
Potassium: 3.8 mEq/L (ref 3.5–5.1)
Sodium: 138 mEq/L (ref 135–145)
Total Bilirubin: 0.4 mg/dL (ref 0.3–1.2)

## 2010-10-28 LAB — SURGICAL PCR SCREEN: Staphylococcus aureus: NEGATIVE

## 2010-10-28 LAB — APTT: aPTT: 32 seconds (ref 24–37)

## 2010-10-28 LAB — PROTIME-INR: Prothrombin Time: 12.3 seconds (ref 11.6–15.2)

## 2010-11-04 ENCOUNTER — Other Ambulatory Visit (INDEPENDENT_AMBULATORY_CARE_PROVIDER_SITE_OTHER): Payer: Self-pay | Admitting: General Surgery

## 2010-11-04 ENCOUNTER — Ambulatory Visit (HOSPITAL_COMMUNITY)
Admission: RE | Admit: 2010-11-04 | Discharge: 2010-11-04 | Disposition: A | Discharge status: Home / Self Care | Payer: Commercial Managed Care - PPO | Source: Ambulatory Visit | Attending: General Surgery | Admitting: General Surgery

## 2010-11-04 ENCOUNTER — Ambulatory Visit (HOSPITAL_COMMUNITY): Payer: Commercial Managed Care - PPO

## 2010-11-04 DIAGNOSIS — E785 Hyperlipidemia, unspecified: Secondary | ICD-10-CM | POA: Insufficient documentation

## 2010-11-04 DIAGNOSIS — Z01810 Encounter for preprocedural cardiovascular examination: Secondary | ICD-10-CM | POA: Insufficient documentation

## 2010-11-04 DIAGNOSIS — F17200 Nicotine dependence, unspecified, uncomplicated: Secondary | ICD-10-CM | POA: Insufficient documentation

## 2010-11-04 DIAGNOSIS — K8010 Calculus of gallbladder with chronic cholecystitis without obstruction: Secondary | ICD-10-CM | POA: Insufficient documentation

## 2010-11-04 DIAGNOSIS — Z01812 Encounter for preprocedural laboratory examination: Secondary | ICD-10-CM | POA: Insufficient documentation

## 2010-11-04 DIAGNOSIS — Z79899 Other long term (current) drug therapy: Secondary | ICD-10-CM | POA: Insufficient documentation

## 2010-11-04 DIAGNOSIS — I10 Essential (primary) hypertension: Secondary | ICD-10-CM | POA: Insufficient documentation

## 2010-11-04 DIAGNOSIS — K219 Gastro-esophageal reflux disease without esophagitis: Secondary | ICD-10-CM | POA: Insufficient documentation

## 2010-11-20 NOTE — Op Note (Signed)
NAME:  Erika Mason, JERRY NO.:  [id number]  MEDICAL RECORD NO.:  [id number]  LOCATION:  DAYL                         FACILITY:  [facility]  PHYSICIAN:  Lodema Pilot, MD       DATE OF BIRTH:  [birth year]  DATE OF PROCEDURE:  11/04/2010 DATE OF DISCHARGE:  11/04/2010                              OPERATIVE REPORT   PROCEDURE:  Laparoscopic cholecystectomy with intraoperative cholangiogram.  PREOPERATIVE DIAGNOSIS:  Symptomatic cholelithiasis.  POSTOPERATIVE DIAGNOSIS:  Symptomatic cholelithiasis.  SURGEON:  Lodema Pilot, MD  ASSISTANT:  Anselm Pancoast. Zachery Dakins, MD  ANESTHESIA:  General endotracheal anesthesia with 30 cc of 1% lidocaine with epinephrine and 0.25% Marcaine injected in 50:50 mixture.  FLUIDS:  1800 cc of crystalloid.  ESTIMATED BLOOD LOSS:  Minimal.  DRAINS:  None.  SPECIMENS:  Gallbladder and contents sent to Pathology for permanent sectioning.  COMPLICATIONS:  None apparent.  FINDINGS:  Small contracted gallbladder with 2 moderate size pigment stones and some sludge.  One of the stones was extracted from the distal cystic duct occluding the cystic duct and preventing cholangiogram. Again, this was extracted.  She had a dilated cystic duct, it was clipped and Endoloop placed.  Normal cholangiogram after the stone extraction with normal filling of the right and left hepatic ducts, common duct without filling defect and free flow of contrast into the duodenum.  INDICATION FOR PROCEDURE:  Erika Mason is a 65 year old female with signs and symptoms consistent with symptomatic cholelithiasis.  She had an ultrasound demonstrating cholelithiasis.  LFTs were normal and normal physical exam.  Desired cholecystectomy for relief of her abdominal pain.  OPERATIVE DETAILS:  Erika Mason was seen and evaluated in the preoperative area and risks and benefits of the procedure were again discussed in lay terms and informed consent was obtained.  She was  taken to the operating room and placed on the table in supine position and prophylactic antibiotics were given.  General endotracheal anesthesia was obtained and procedure time-out was performed with all other team members to confirm proper patient and procedure.  A Foley catheter was placed and her abdomen was prepped and draped in a standard surgical fashion.  A semicircular infraumbilical incision was made in the skin and dissection carried down through the subcutaneous tissues using blunt dissection.  The abdominal wall fascia was elevated and sharply incised to the peritoneum under direct visualization.  A 12 mm balloon trocar was placed into the umbilicus and pneumoperitoneum was obtained.  After scope was introduced and there was no evidence of bowel injury upon entry, 11 mm epigastric trocar and two 5 mm right upper quadrant trocars were placed, all under direct visualization.  The gallbladder was retracted cephalad.  She had some omental adhesions to the gallbladder and to the liver, which were taken with sharp dissection.  Her gallbladder wall was thickened and contracted, consistent with a chronic cholecystitis.  Peritoneum was taken down using blunt dissection and the node of Calot was identified.  This was dissected from the gallbladder and taken down medially and the cystic duct was identified and skeletonized.  The critical view of safety was obtained by further dissecting the triangle of Calot, visualized the single cystic duct coursing  to the gallbladder and liver parenchyma visualized through the triangle of Calot and a single cystic artery.  Cystic artery was clipped and transected, which further opened the triangle of Calot and again, visualizing a single cystic duct coursing up on to the gallbladder.  The cystic duct artery was fairly wide and a cholangiogram was performed.  A clip was placed on the gallbladder and cystic duct junction and a small cystic ductotomy was  made, cholangiogram catheter was placed through the abdominal wall through a 14-gauge angiocatheter and placed into the cystic duct.  Balloon was insufflated and correctly endoscoped into place.  We attempted to perform cholangiogram, however, there was no flow into the common bile duct and leakage from the catheter.  Balloon was let down and catheter replaced and we attempted again with similar results of leakage of contrast from the cystic ductotomy.  At this time, we then milked back the common duct bluntly and we extracted a moderate- size pigment stone through the cystic duct.  This was removed with the stone forceps.  We again placed the cholangiogram catheter and attempted a cholangiogram at this time.  There was good opacification of the common bile duct with a long cystic duct, free flow of bile into the duodenum and no common bile duct filling defects and visualization of the right and left hepatic ducts.  The cholangiogram catheter was removed and a clip was placed on the cystic duct stump, however, given the wide cystic duct stump, I also placed a 0 PDS Endoloop below the clip and the gallbladder was then dissected from the gallbladder fossa using Bovie electrocautery and gallbladder was not entered during the dissection.  The gallbladder was removed from the abdomen through the umbilicus in an EndoCatch bag and examined on the back table and noted to have a single cystic duct with thick bile and another pigment stone so much that we went and extracted from the common bile duct for a total of 2 pigment gallstones.  The specimen was passed off the table and sent to Pathology for permanent sectioning.  The gallbladder fossa was suspected to be in place, which was noted to be adequate and the right upper quadrant was irrigated and suctioned until the irrigation returned clear.  There was no evidence of bleeding.  The clip and Endoloop appeared to be in good position.  The right  upper quadrant trocar was removed under direct visualization, abdominal wall was noted to be hemostatic, and the umbilical trocar was removed and the umbilical fascia was approximated with interrupted 0 Vicryl sutures in an open fashion.  Prior to securing the sutures, the abdominal wall closure was inspected through the epigastric trocar site and noted to be adequate without any evidence of bowel injury.  There was no evidence of bleeding in the bowel in the right upper quadrant.  No evidence of bowel injury throughout.  The sutures were secured and the epigastric trocar was removed.  The wounds were irrigated and the skin was anesthetized with 30 cc of 1% lidocaine with epinephrine and 0.25% Marcaine in a 50:50 mixture.  The skin edges were approximated with a 4-0 Monocryl subcuticular suture and Dermabond was applied.  All sponge, needle, and instrument counts were correct at the end of the case, and the patient tolerated the procedure well without apparent complications.  Foley catheter was removed and she was stable and ready for transfer to the recovery room in stable condition.         ______________________________  Lodema Pilot, MD    BL/MEDQ  D:  11/04/2010  T:  11/04/2010  Job:  [id number]  Electronically Signed by Lodema Pilot DO on 11/20/2010 06:01:40 PM

## 2010-11-23 ENCOUNTER — Ambulatory Visit (INDEPENDENT_AMBULATORY_CARE_PROVIDER_SITE_OTHER): Payer: Commercial Managed Care - PPO | Admitting: General Surgery

## 2010-11-23 VITALS — BP 128/82 | HR 70 | Temp 98.0°F

## 2010-11-23 DIAGNOSIS — Z4889 Encounter for other specified surgical aftercare: Secondary | ICD-10-CM

## 2010-11-23 DIAGNOSIS — Z5189 Encounter for other specified aftercare: Secondary | ICD-10-CM

## 2010-11-23 NOTE — Progress Notes (Signed)
Subjective:     Patient ID: Erika Mason, female   DOB: 1945-06-19, 65 y.o.   MRN: [id number]  HPI She follows up today status post laparoscopic cholecystectomy. She has no complaints and reports doing very well. She states that she stopped taking her pain medicine on postoperative day #1. She is tolerating regular diet and her bowels are functioning well. She does have some loose stools but she states this does not bother her. Her pathology was benign.  Review of Systems     Objective:   Physical Exam Her abdomen is soft, nontender, nondistended, and her incisions are healing well without sign of infection.    Assessment:     Status post laparoscopic cholecystectomy-doing very well    Plan:     She is doing very well and there no apparent complications. Her pathology was benign. She can follow with me on a p.r.n. Basis.

## 2011-01-07 ENCOUNTER — Other Ambulatory Visit: Payer: Self-pay | Admitting: Emergency Medicine

## 2011-01-07 DIAGNOSIS — E278 Other specified disorders of adrenal gland: Secondary | ICD-10-CM

## 2011-01-07 DIAGNOSIS — IMO0001 Reserved for inherently not codable concepts without codable children: Secondary | ICD-10-CM

## 2011-01-11 ENCOUNTER — Other Ambulatory Visit (HOSPITAL_COMMUNITY): Payer: Commercial Managed Care - PPO

## 2011-01-11 ENCOUNTER — Ambulatory Visit (HOSPITAL_COMMUNITY)
Admission: RE | Admit: 2011-01-11 | Discharge: 2011-01-11 | Disposition: A | Discharge status: Home / Self Care | Payer: Commercial Managed Care - PPO | Source: Ambulatory Visit | Attending: Emergency Medicine | Admitting: Emergency Medicine

## 2011-01-11 ENCOUNTER — Other Ambulatory Visit: Payer: Self-pay | Admitting: Emergency Medicine

## 2011-01-11 DIAGNOSIS — IMO0001 Reserved for inherently not codable concepts without codable children: Secondary | ICD-10-CM

## 2011-01-11 DIAGNOSIS — E278 Other specified disorders of adrenal gland: Secondary | ICD-10-CM | POA: Insufficient documentation

## 2011-01-11 DIAGNOSIS — J984 Other disorders of lung: Secondary | ICD-10-CM | POA: Insufficient documentation

## 2011-06-13 ENCOUNTER — Encounter: Payer: Self-pay | Admitting: Physician Assistant

## 2011-07-11 ENCOUNTER — Other Ambulatory Visit: Payer: Self-pay | Admitting: Family Medicine

## 2011-07-11 ENCOUNTER — Other Ambulatory Visit: Payer: Self-pay | Admitting: Physician Assistant

## 2011-07-11 MED ORDER — PANTOPRAZOLE SODIUM 40 MG PO TBEC: 40.0000 mg | DELAYED_RELEASE_TABLET | Freq: Every day | ORAL | Status: DC

## 2011-08-02 ENCOUNTER — Encounter: Payer: Self-pay | Admitting: Physician Assistant

## 2011-08-02 ENCOUNTER — Ambulatory Visit (INDEPENDENT_AMBULATORY_CARE_PROVIDER_SITE_OTHER): Payer: Commercial Managed Care - PPO | Admitting: Physician Assistant

## 2011-08-02 VITALS — BP 116/82 | HR 88 | Temp 98.0°F | Resp 16 | Ht 63.0 in | Wt 156.2 lb

## 2011-08-02 DIAGNOSIS — K219 Gastro-esophageal reflux disease without esophagitis: Secondary | ICD-10-CM

## 2011-08-02 DIAGNOSIS — D179 Benign lipomatous neoplasm, unspecified: Secondary | ICD-10-CM

## 2011-08-02 DIAGNOSIS — E785 Hyperlipidemia, unspecified: Secondary | ICD-10-CM

## 2011-08-02 DIAGNOSIS — R739 Hyperglycemia, unspecified: Secondary | ICD-10-CM

## 2011-08-02 DIAGNOSIS — I10 Essential (primary) hypertension: Secondary | ICD-10-CM

## 2011-08-02 DIAGNOSIS — E039 Hypothyroidism, unspecified: Secondary | ICD-10-CM

## 2011-08-02 DIAGNOSIS — R609 Edema, unspecified: Secondary | ICD-10-CM

## 2011-08-02 LAB — CBC WITH DIFFERENTIAL/PLATELET
Basophils Relative: 1 % (ref 0–1)
Eosinophils Absolute: 0.1 10*3/uL (ref 0.0–0.7)
Lymphs Abs: 1.9 10*3/uL (ref 0.7–4.0)
MCH: 31.5 pg (ref 26.0–34.0)
MCHC: 33.9 g/dL (ref 30.0–36.0)
Neutrophils Relative %: 71 % (ref 43–77)
Platelets: 294 10*3/uL (ref 150–400)
RBC: 5.05 MIL/uL (ref 3.87–5.11)

## 2011-08-02 LAB — COMPREHENSIVE METABOLIC PANEL
ALT: 26 U/L (ref 0–35)
AST: 24 U/L (ref 0–37)
Alkaline Phosphatase: 104 U/L (ref 39–117)
Sodium: 137 mEq/L (ref 135–145)
Total Bilirubin: 0.8 mg/dL (ref 0.3–1.2)
Total Protein: 7.1 g/dL (ref 6.0–8.3)

## 2011-08-02 LAB — LIPID PANEL
LDL Cholesterol: 81 mg/dL (ref 0–99)
VLDL: 35 mg/dL (ref 0–40)

## 2011-08-02 LAB — GLUCOSE, POCT (MANUAL RESULT ENTRY): POC Glucose: 128

## 2011-08-02 MED ORDER — PANTOPRAZOLE SODIUM 40 MG PO TBEC: 40.0000 mg | DELAYED_RELEASE_TABLET | Freq: Every day | ORAL | Status: DC

## 2011-08-02 MED ORDER — ATORVASTATIN CALCIUM 40 MG PO TABS: 40.0000 mg | ORAL_TABLET | Freq: Every day | ORAL | Status: DC

## 2011-08-02 MED ORDER — LEVOTHYROXINE SODIUM 88 MCG PO TABS: 88.0000 ug | ORAL_TABLET | Freq: Every day | ORAL | Status: DC

## 2011-08-02 MED ORDER — HYDROCHLOROTHIAZIDE 25 MG PO TABS: 25.0000 mg | ORAL_TABLET | Freq: Every day | ORAL | Status: DC

## 2011-08-02 NOTE — Progress Notes (Signed)
  Subjective:    Patient ID: Erika Mason, female    DOB: [birth year], 66 y.o.   MRN: [id number]  HPI [name] is here for medication refills. She states that she is doing well.  She states that she has noticed some palpitations and racing heart in the evenoings after work, but denies chest pain, pressure,or SOB. She denies any symptoms of GERD since her cholecystectomy.   She denies any sig. weight loss or gain, heat or cold intolerance, fever, nausea, vomiting or other constitutional sxs, headache or visual changes. She has noticed a small lump on her left lower back. She denies pain, heat, redness, swelling, or discharge. Patient is daily smoker.  She is not interested in quitting at this time.  We discussed at length her options for quitting as well as health benefits of quitting.  Review of Systems    as stated in the HPI Objective:   Physical Exam  Constitutional: She appears well-developed and well-nourished. No distress.  HENT:  Head: Normocephalic and atraumatic.  Neck: No thyromegaly present.  Cardiovascular: Normal rate, regular rhythm and normal heart sounds.   Pulmonary/Chest: Effort normal and breath sounds normal.  Skin: Skin is warm and dry. She is not diaphoretic.       There is a 2 cm non-tender, soft, round, mobile, mass located on the left inferior back proximal to the spine.     Results for orders placed in visit on 08/02/11  GLUCOSE, POCT (MANUAL RESULT ENTRY)      Component Value Range   POC Glucose 128    POCT GLYCOSYLATED HEMOGLOBIN (HGB A1C)      Component Value Range   Hemoglobin A1C 6.0          Assessment & Plan:   1. Hypertension  hydrochlorothiazide (HYDRODIURIL) 25 MG tablet, CBC with Differential, Comprehensive metabolic panel, TSH  2. Edema  hydrochlorothiazide (HYDRODIURIL) 25 MG tablet, CBC with Differential, Comprehensive metabolic panel, Lipid panel, TSH  3. Hyperlipidemia  atorvastatin (LIPITOR) 40 MG tablet, CBC with Differential,  Lipid panel  4. GERD (gastroesophageal reflux disease)  pantoprazole (PROTONIX) 40 MG tablet  5. Hyperglycemia  POCT glucose (manual entry), POCT glycosylated hemoglobin (Hb A1C), CBC with Differential, Comprehensive metabolic panel. Will continue to monitor blood glucose at future visits  6. Hypothyroid  levothyroxine (SYNTHROID, LEVOTHROID) 88 MCG tablet, CBC with Differential, Comprehensive metabolic panel, Lipid panel, TSH  7. Lipoma     Smoking cessation.

## 2011-08-02 NOTE — Patient Instructions (Addendum)
You may have atrial period without your pantoprazole. Please begin taking the medicine again if GERD symptoms reoccur.   We will call you with the results of your bloodwork.   Smoking Cessation This document explains the best ways for you to quit smoking and new treatments to help. It lists new medicines that can double or triple your chances of quitting and quitting for good. It also considers ways to avoid relapses and concerns you may have about quitting, including weight gain. NICOTINE: A POWERFUL ADDICTION If you have tried to quit smoking, you know how hard it can be. It is hard because nicotine is a very addictive drug. For some people, it can be as addictive as heroin or cocaine. Usually, people make 2 or 3 tries, or more, before finally being able to quit. Each time you try to quit, you can learn about what helps and what hurts. Quitting takes hard work and a lot of effort, but you can quit smoking. QUITTING SMOKING IS ONE OF THE MOST IMPORTANT THINGS YOU WILL EVER DO.  You will live longer, feel better, and live better.   The impact on your body of quitting smoking is felt almost immediately:   Within 20 minutes, blood pressure decreases. Pulse returns to its normal level.   After 8 hours, carbon monoxide levels in the blood return to normal. Oxygen level increases.   After 24 hours, chance of heart attack starts to decrease. Breath, hair, and body stop smelling like smoke.   After 48 hours, damaged nerve endings begin to recover. Sense of taste and smell improve.   After 72 hours, the body is virtually free of nicotine. Bronchial tubes relax and breathing becomes easier.   After 2 to 12 weeks, lungs can hold more air. Exercise becomes easier and circulation improves.   Quitting will reduce your risk of having a heart attack, stroke, cancer, or lung disease:   After 1 year, the risk of coronary heart disease is cut in half.   After 5 years, the risk of stroke falls to the same  as a nonsmoker.   After 10 years, the risk of lung cancer is cut in half and the risk of other cancers decreases significantly.   After 15 years, the risk of coronary heart disease drops, usually to the level of a nonsmoker.   If you are pregnant, quitting smoking will improve your chances of having a healthy baby.   The people you live with, especially your children, will be healthier.   You will have extra money to spend on things other than cigarettes.  FIVE KEYS TO QUITTING Studies have shown that these 5 steps will help you quit smoking and quit for good. You have the best chances of quitting if you use them together: 1. Get ready.  2. Get support and encouragement.  3. Learn new skills and behaviors.  4. Get medicine to reduce your nicotine addiction and use it correctly.  5. Be prepared for relapse or difficult situations. Be determined to continue trying to quit, even if you do not succeed at first.  1. GET READY  Set a quit date.   Change your environment.   Get rid of ALL cigarettes, ashtrays, matches, and lighters in your home, car, and place of work.   Do not let people smoke in your home.   Review your past attempts to quit. Think about what worked and what did not.   Once you quit, do not smoke. NOT EVEN A  PUFF!  2. GET SUPPORT AND ENCOURAGEMENT Studies have shown that you have a better chance of being successful if you have help. You can get support in many ways.  Tell your family, friends, and coworkers that you are going to quit and need their support. Ask them not to smoke around you.   Talk to your caregivers (doctor, dentist, nurse, pharmacist, psychologist, and/or smoking counselor).   Get individual, group, or telephone counseling and support. The more counseling you have, the better your chances are of quitting. Programs are available at [employer] and health centers. Call your local health department for information about programs in your area.    Spiritual beliefs and practices may help some smokers quit.   Quit meters are Photographer that keep track of quit statistics, such as amount of "quit-time," cigarettes not smoked, and money saved.   Many smokers find one or more of the many self-help books available useful in helping them quit and stay off tobacco.  3. LEARN NEW SKILLS AND BEHAVIORS  Try to distract yourself from urges to smoke. Talk to someone, go for a walk, or occupy your time with a task.   When you first try to quit, change your routine. Take a different route to work. Drink tea instead of coffee. Eat breakfast in a different place.   Do something to reduce your stress. Take a hot bath, exercise, or read a book.   Plan something enjoyable to do every day. Reward yourself for not smoking.   Explore interactive web-based programs that specialize in helping you quit.  4. GET MEDICINE AND USE IT CORRECTLY Medicines can help you stop smoking and decrease the urge to smoke. Combining medicine with the above behavioral methods and support can quadruple your chances of successfully quitting smoking. The U.S. Food and Drug Administration (FDA) has approved 7 medicines to help you quit smoking. These medicines fall into 3 categories.  Nicotine replacement therapy (delivers nicotine to your body without the negative effects and risks of smoking):   Nicotine gum: Available over-the-counter.   Nicotine lozenges: Available over-the-counter.   Nicotine inhaler: Available by prescription.   Nicotine nasal spray: Available by prescription.   Nicotine skin patches (transdermal): Available by prescription and over-the-counter.   Antidepressant medicine (helps people abstain from smoking, but how this works is unknown):   Bupropion sustained-release (SR) tablets: Available by prescription.   Nicotinic receptor partial agonist (simulates the effect of nicotine in your brain):   Varenicline  tartrate tablets: Available by prescription.   Ask your caregiver for advice about which medicines to use and how to use them. Carefully read the information on the package.   Everyone who is trying to quit may benefit from using a medicine. If you are pregnant or trying to become pregnant, nursing an infant, you are under age 32, or you smoke fewer than 10 cigarettes per day, talk to your caregiver before taking any nicotine replacement medicines.   You should stop using a nicotine replacement product and call your caregiver if you experience nausea, dizziness, weakness, vomiting, fast or irregular heartbeat, mouth problems with the lozenge or gum, or redness or swelling of the skin around the patch that does not go away.   Do not use any other product containing nicotine while using a nicotine replacement product.   Talk to your caregiver before using these products if you have diabetes, heart disease, asthma, stomach ulcers, you had a recent heart attack, you have  high blood pressure that is not controlled with medicine, a history of irregular heartbeat, or you have been prescribed medicine to help you quit smoking.  5. BE PREPARED FOR RELAPSE OR DIFFICULT SITUATIONS  Most relapses occur within the first 3 months after quitting. Do not be discouraged if you start smoking again. Remember, most people try several times before they finally quit.   You may have symptoms of withdrawal because your body is used to nicotine. You may crave cigarettes, be irritable, feel very hungry, cough often, get headaches, or have difficulty concentrating.   The withdrawal symptoms are only temporary. They are strongest when you first quit, but they will go away within 10 to 14 days.  Here are some difficult situations to watch for:  Alcohol. Avoid drinking alcohol. Drinking lowers your chances of successfully quitting.   Caffeine. Try to reduce the amount of caffeine you consume. It also lowers your chances of  successfully quitting.   Other smokers. Being around smoking can make you want to smoke. Avoid smokers.   Weight gain. Many smokers will gain weight when they quit, usually less than 10 pounds. Eat a healthy diet and stay active. Do not let weight gain distract you from your main goal, quitting smoking. Some medicines that help you quit smoking may also help delay weight gain. You can always lose the weight gained after you quit.   Bad mood or depression. There are a lot of ways to improve your mood other than smoking.  If you are having problems with any of these situations, talk to your caregiver. SPECIAL SITUATIONS AND CONDITIONS Studies suggest that everyone can quit smoking. Your situation or condition can give you a special reason to quit.  Pregnant women/new mothers: By quitting, you protect your baby's health and your own.   Hospitalized patients: By quitting, you reduce health problems and help healing.   Heart attack patients: By quitting, you reduce your risk of a second heart attack.   Lung, head, and neck cancer patients: By quitting, you reduce your chance of a second cancer.   Parents of children and adolescents: By quitting, you protect your children from illnesses caused by secondhand smoke.  QUESTIONS TO THINK ABOUT Think about the following questions before you try to stop smoking. You may want to talk about your answers with your caregiver.  Why do you want to quit?   If you tried to quit in the past, what helped and what did not?   What will be the most difficult situations for you after you quit? How will you plan to handle them?   Who can help you through the tough times? Your family? Friends? Caregiver?   What pleasures do you get from smoking? What ways can you still get pleasure if you quit?  Here are some questions to ask your caregiver:  How can you help me to be successful at quitting?   What medicine do you think would be best for me and how should I  take it?   What should I do if I need more help?   What is smoking withdrawal like? How can I get information on withdrawal?  Quitting takes hard work and a lot of effort, but you can quit smoking. FOR MORE INFORMATION  Smokefree.gov (http://www.davis-sullivan.com/) provides free, accurate, evidence-based information and professional assistance to help support the immediate and long-term needs of people trying to quit smoking. Document Released: 03/22/2001 Document Revised: 03/17/2011 Document Reviewed: 01/12/2009 [facility] Patient Information 2012 [employer]. 

## 2011-08-03 NOTE — Progress Notes (Signed)
I have examined this patient along with the student and agree.  

## 2011-11-03 ENCOUNTER — Telehealth: Payer: Self-pay

## 2011-11-03 NOTE — Telephone Encounter (Signed)
Pt states we referred her to a dermatologist awhile back and she believes that she needs a new referral for a spot on the lower right of her cheek  Best number [phone]

## 2011-11-03 NOTE — Telephone Encounter (Signed)
Yes we can do a referral but if the patient been seen in the last 3 years she should be able to call and set up her own appt.  If she needs a referral I need to know who she saw.

## 2011-12-16 NOTE — Op Note (Signed)
[facility] [address] [place], [place]  [address]  ENDOSCOPY PROCEDURE REPORT  PATIENT: Erika Mason, Erika Mason MR#: #[id number] BIRTHDATE: June 28, 1945, 64 yrs. old GENDER: female  ENDOSCOPIST: Hedwig Morton. Juanda Chance, MD Referred by:     Dr Larwance Rote.  PROCEDURE DATE:  09/03/2010 PROCEDURE: EGD with biopsy, 43239 ASA CLASS: Class II INDICATIONS:     GERD, heartburn  MEDICATIONS:  Versed 6 mg, Fentanyl 50 mcg TOPICAL ANESTHETIC: Exactacain Spray  DESCRIPTION OF PROCEDURE:   After the risks benefits and alternatives of the procedure were thoroughly explained, informed consent was obtained.  The LB GIF-H180 [id number] endoscope was introduced through the mouth and advanced to the second portion of the duodenum, without limitations.  The instrument was slowly withdrawn as the mucosa was fully examined.   Esophagitis was found in the distal esophagus. Grade 1 esophagitis, no stricture With standard forceps, a biopsy was obtained and sent to pathology (see image1, image2, and image6).  Otherwise the examination was normal (see image4, image5, and image3). Retroflexed views revealed no abnormalities.    The scope was then withdrawn from the patient and the procedure completed.  COMPLICATIONS: None  ENDOSCOPIC IMPRESSION: 1) Esophagitis in the distal esophagus 2) Otherwise normal examination RECOMMENDATIONS: 1) Anti-reflux regimen to be follow 2) Await biopsy results continue Protonix 40 mg/day  REPEAT EXAM: In 0 year(s) for.   _______________________________ Hedwig Morton. Juanda Chance, MD    CC:   n. eSIGNED:   Hedwig Morton. Brodie at 09/03/2010 06:02 PM      [place], [place], #[phone]

## 2011-12-16 NOTE — Op Note (Addendum)
[facility] [address] [place], [place]  [address]  COLONOSCOPY PROCEDURE REPORT  PATIENT: Erika Mason, Erika Mason MR#: #[id number] BIRTHDATE: January 14, 1946, 64 yrs. old GENDER: female ENDOSCOPIST: Hedwig Morton. Juanda Chance, MD REF. NW:[id number] Chelle     Theora Gianotti, P.A. PROCEDURE DATE:  09/03/2010 PROCEDURE: Colonoscopy 08657 ASA CLASS: Class II INDICATIONS:     history of pre-cancerous (adenomatous) colon polyps multiple tubular adenomas in1/2009, also 1 serrated adenoma MEDICATIONS:  Versed 4 mg, Fentanyl 50 mcg  DESCRIPTION OF PROCEDURE:   After the risks benefits and alternatives of the procedure were thoroughly explained, informed consent was obtained.  Digital rectal exam was performed and revealed no rectal masses.   The LB [id number]-[id number] endoscope was introduced through the anus and advanced to the cecum, which was identified by both the appendix and ileocecal valve, without limitations.  The quality of the prep was good, using MoviPrep. The instrument was then slowly withdrawn as the colon was fully examined.   FINDINGS:  There were multiple polyps identified and removed. 6 polyps, 2 at 90 cm, 10mm each, sessile 4 polyps in the rectosigmoid 3 -8 mm With standard forceps, biopsy was obtained and sent to pathology. Polyps were snared, then cauterized with monopolar cautery. Retrieval was successful (see image1, image2, image3, image10, image11, and image12). snare polyp Mild diverticulosis was found in the sigmoid colon (see image13, image9, and image4).  This was otherwise a normal examination of the colon (see image5, image6, image7, and image14).   Retroflexed views in the rectum revealed no abnormalities.    The scope was then withdrawn from the patient and the procedure completed.  COMPLICATIONS: None ENDOSCOPIC IMPRESSION: 1) Polyps, multiple 2) Mild diverticulosis in the sigmoid colon 3) Otherwise normal examination RECOMMENDATIONS: 1) Await pathology  results 2) High fiber diet. no ASA x 2 weeks REPEAT EXAM: In 3 - 5 year(s) for.   _______________________________ Hedwig Morton. Juanda Chance, MD  CC:   n. eSIGNED:   Hedwig Morton. Brodie at 09/03/2010 06:10 PM      Miskell, Zollie Scale, #[id number]

## 2012-01-25 ENCOUNTER — Other Ambulatory Visit: Payer: Self-pay | Admitting: Physician Assistant

## 2012-01-25 NOTE — Telephone Encounter (Signed)
It looks like at her April 2013 visit she was prescribed 90 pills with 4 refills which should be enough for a full year.  Please call patient and clarify that this is the case and that she is taking just one pill daily

## 2012-01-30 ENCOUNTER — Other Ambulatory Visit: Payer: Self-pay | Admitting: Physician Assistant

## 2012-04-27 ENCOUNTER — Other Ambulatory Visit: Payer: Self-pay | Admitting: Physician Assistant

## 2012-07-23 ENCOUNTER — Other Ambulatory Visit: Payer: Self-pay | Admitting: Physician Assistant

## 2012-08-14 ENCOUNTER — Ambulatory Visit (INDEPENDENT_AMBULATORY_CARE_PROVIDER_SITE_OTHER): Payer: Commercial Managed Care - PPO | Admitting: Physician Assistant

## 2012-08-14 ENCOUNTER — Encounter: Payer: Self-pay | Admitting: Physician Assistant

## 2012-08-14 VITALS — BP 123/81 | HR 72 | Temp 98.2°F | Resp 16 | Ht 63.5 in | Wt 166.0 lb

## 2012-08-14 DIAGNOSIS — Z86010 Personal history of colonic polyps: Secondary | ICD-10-CM | POA: Insufficient documentation

## 2012-08-14 DIAGNOSIS — M8580 Other specified disorders of bone density and structure, unspecified site: Secondary | ICD-10-CM | POA: Insufficient documentation

## 2012-08-14 DIAGNOSIS — Z1159 Encounter for screening for other viral diseases: Secondary | ICD-10-CM

## 2012-08-14 DIAGNOSIS — E039 Hypothyroidism, unspecified: Secondary | ICD-10-CM | POA: Insufficient documentation

## 2012-08-14 DIAGNOSIS — IMO0001 Reserved for inherently not codable concepts without codable children: Secondary | ICD-10-CM | POA: Insufficient documentation

## 2012-08-14 DIAGNOSIS — R609 Edema, unspecified: Secondary | ICD-10-CM | POA: Insufficient documentation

## 2012-08-14 DIAGNOSIS — E119 Type 2 diabetes mellitus without complications: Secondary | ICD-10-CM | POA: Insufficient documentation

## 2012-08-14 DIAGNOSIS — E559 Vitamin D deficiency, unspecified: Secondary | ICD-10-CM | POA: Insufficient documentation

## 2012-08-14 DIAGNOSIS — K219 Gastro-esophageal reflux disease without esophagitis: Secondary | ICD-10-CM

## 2012-08-14 DIAGNOSIS — E785 Hyperlipidemia, unspecified: Secondary | ICD-10-CM | POA: Insufficient documentation

## 2012-08-14 DIAGNOSIS — K5790 Diverticulosis of intestine, part unspecified, without perforation or abscess without bleeding: Secondary | ICD-10-CM | POA: Insufficient documentation

## 2012-08-14 DIAGNOSIS — R739 Hyperglycemia, unspecified: Secondary | ICD-10-CM

## 2012-08-14 DIAGNOSIS — R222 Localized swelling, mass and lump, trunk: Secondary | ICD-10-CM

## 2012-08-14 DIAGNOSIS — I10 Essential (primary) hypertension: Secondary | ICD-10-CM

## 2012-08-14 DIAGNOSIS — N200 Calculus of kidney: Secondary | ICD-10-CM | POA: Insufficient documentation

## 2012-08-14 LAB — COMPREHENSIVE METABOLIC PANEL
ALT: 35 U/L (ref 0–35)
AST: 24 U/L (ref 0–37)
CO2: 26 mEq/L (ref 19–32)
Sodium: 141 mEq/L (ref 135–145)
Total Bilirubin: 0.8 mg/dL (ref 0.3–1.2)
Total Protein: 6.8 g/dL (ref 6.0–8.3)

## 2012-08-14 LAB — LIPID PANEL
Cholesterol: 167 mg/dL (ref 0–200)
LDL Cholesterol: 97 mg/dL (ref 0–99)
VLDL: 25 mg/dL (ref 0–40)

## 2012-08-14 LAB — CBC WITH DIFFERENTIAL/PLATELET
Basophils Absolute: 0 10*3/uL (ref 0.0–0.1)
Eosinophils Absolute: 0.1 10*3/uL (ref 0.0–0.7)
Lymphocytes Relative: 25 % (ref 12–46)
Lymphs Abs: 2.1 10*3/uL (ref 0.7–4.0)
Neutrophils Relative %: 67 % (ref 43–77)
Platelets: 266 10*3/uL (ref 150–400)
RBC: 4.57 MIL/uL (ref 3.87–5.11)
WBC: 8.5 10*3/uL (ref 4.0–10.5)

## 2012-08-14 LAB — TSH: TSH: 1.201 u[IU]/mL (ref 0.350–4.500)

## 2012-08-14 LAB — HEPATITIS C ANTIBODY: HCV Ab: NEGATIVE

## 2012-08-14 MED ORDER — CHLORTHALIDONE 25 MG PO TABS: 25.0000 mg | ORAL_TABLET | Freq: Every day | ORAL | Status: DC

## 2012-08-14 MED ORDER — LEVOTHYROXINE SODIUM 88 MCG PO TABS: 88.0000 ug | ORAL_TABLET | Freq: Every day | ORAL | Status: DC

## 2012-08-14 MED ORDER — PANTOPRAZOLE SODIUM 40 MG PO TBEC: 40.0000 mg | DELAYED_RELEASE_TABLET | Freq: Every day | ORAL | Status: DC

## 2012-08-14 MED ORDER — ATORVASTATIN CALCIUM 40 MG PO TABS: 40.0000 mg | ORAL_TABLET | Freq: Every day | ORAL | Status: DC

## 2012-08-14 NOTE — Progress Notes (Signed)
  Subjective:    Patient ID: Erika Mason, female    DOB: 12/15/1945, 67 y.o.   MRN: [id number]  HPI This 67 y.o. female presents for medication refills.  She was last here 07/2011.  She denies any problems or concerns today.  Has quit smoking.  Her brother is sober, so his living with her is good.  Past medical history, surgical history, family history, social history and problem list reviewed.   Review of Systems No chest pain, SOB, HA, dizziness, vision change, N/V, diarrhea, constipation, dysuria, urinary urgency or frequency, myalgias, arthralgias or rash.     Objective:   Physical Exam Blood pressure 123/81, pulse 72, temperature 98.2 F (36.8 C), resp. rate 16, height 5' 3.5" (1.613 m), weight 166 lb (75.297 kg). Body mass index is 28.94 kg/(m^2). Weight is up 10 lbs since last year. Well-developed, well nourished WF who is awake, alert and oriented, in NAD. HEENT: [place]/AT, PERRL, EOMI.  Sclera and conjunctiva are clear.  EAC are patent, TMs are normal in appearance. Nasal mucosa is pink and moist. OP is clear. Neck: supple, non-tender, no lymphadenopathy, thyromegaly. Heart: RRR, no murmur Lungs: normal effort, CTA Extremities: no cyanosis, clubbing or edema. Skin: warm and dry without rash. Psychologic: good mood and appropriate affect, normal speech and behavior.     Assessment & Plan:  Hypothyroidism - Plan: levothyroxine (SYNTHROID, LEVOTHROID) 88 MCG tablet, TSH  HTN (hypertension) - Plan: chlorthalidone (HYGROTON) 25 MG tablet, CBC with Differential, Comprehensive metabolic panel  Hyperlipidemia - Plan: atorvastatin (LIPITOR) 40 MG tablet, Lipid panel  GERD (gastroesophageal reflux disease) - Plan: pantoprazole (PROTONIX) 40 MG tablet  Lingular mass - Plan: CT Chest Wo Contrast; was stable 12/2010.  Hypothyroid - Plan: levothyroxine (SYNTHROID, LEVOTHROID) 88 MCG tablet  Need for hepatitis C screening test - Plan: Hepatitis C antibody  Low bone mass - Plan:  update DEXA  Fernande Bras, PA-C Physician Assistant-Certified [facility]

## 2012-08-14 NOTE — Patient Instructions (Signed)
I will contact you with your lab results as soon as they are available.   If you have not heard from me in 2 weeks, please contact me.  The fastest way to get your results is to register for My Chart (see the instructions on the last page of this printout).  If you have not heard anything regarding the CT scan in 1 week, please contact our office.

## 2012-08-16 ENCOUNTER — Ambulatory Visit
Admission: RE | Admit: 2012-08-16 | Discharge: 2012-08-16 | Disposition: A | Discharge status: Home / Self Care | Payer: Commercial Managed Care - PPO | Source: Ambulatory Visit | Attending: Physician Assistant | Admitting: Physician Assistant

## 2012-08-16 DIAGNOSIS — IMO0001 Reserved for inherently not codable concepts without codable children: Secondary | ICD-10-CM

## 2012-10-17 ENCOUNTER — Encounter: Payer: Self-pay | Admitting: Internal Medicine

## 2012-10-20 ENCOUNTER — Other Ambulatory Visit: Payer: Self-pay | Admitting: Physician Assistant

## 2012-11-09 ENCOUNTER — Telehealth: Payer: Self-pay

## 2012-11-09 MED ORDER — HYDROCHLOROTHIAZIDE 25 MG PO TABS: 25.0000 mg | ORAL_TABLET | Freq: Every day | ORAL | Status: DC

## 2012-11-09 NOTE — Telephone Encounter (Signed)
Rx sent to pharmacy   

## 2012-11-09 NOTE — Telephone Encounter (Signed)
Pt would like to discontinue taking chlorthalidone (HYGROTON) 25 MG tablet, she would now like to go back to taking the hctz. [phone]  [facility] [address]

## 2012-11-10 NOTE — Telephone Encounter (Signed)
Patient notified and voiced understanding.

## 2012-11-13 ENCOUNTER — Other Ambulatory Visit: Payer: Self-pay | Admitting: Physician Assistant

## 2012-11-14 ENCOUNTER — Other Ambulatory Visit: Payer: Self-pay

## 2012-12-26 ENCOUNTER — Telehealth: Payer: Self-pay

## 2012-12-26 DIAGNOSIS — Z1239 Encounter for other screening for malignant neoplasm of breast: Secondary | ICD-10-CM

## 2012-12-26 NOTE — Telephone Encounter (Signed)
Mammogram ordered. Patient notified by My Chart.

## 2012-12-26 NOTE — Telephone Encounter (Signed)
Pended order please advise

## 2012-12-26 NOTE — Telephone Encounter (Signed)
Pt sent a msg via MyChart that she is due for her mammogram. Would like a referral and does not remember where she went last year.

## 2013-01-07 ENCOUNTER — Ambulatory Visit (INDEPENDENT_AMBULATORY_CARE_PROVIDER_SITE_OTHER): Payer: Commercial Managed Care - PPO | Admitting: Family Medicine

## 2013-01-07 ENCOUNTER — Ambulatory Visit: Payer: Commercial Managed Care - PPO | Admitting: Family Medicine

## 2013-01-07 DIAGNOSIS — Z23 Encounter for immunization: Secondary | ICD-10-CM

## 2013-01-14 ENCOUNTER — Ambulatory Visit
Admission: RE | Admit: 2013-01-14 | Discharge: 2013-01-14 | Disposition: A | Discharge status: Home / Self Care | Payer: Commercial Managed Care - PPO | Source: Ambulatory Visit | Attending: Physician Assistant | Admitting: Physician Assistant

## 2013-01-14 DIAGNOSIS — Z1239 Encounter for other screening for malignant neoplasm of breast: Secondary | ICD-10-CM

## 2013-02-06 ENCOUNTER — Other Ambulatory Visit: Payer: Self-pay | Admitting: Physician Assistant

## 2013-02-25 ENCOUNTER — Telehealth: Payer: Self-pay

## 2013-02-25 NOTE — Telephone Encounter (Signed)
She needs to see Chelle, I have called her to advise. She was transferred to make the appt.

## 2013-02-25 NOTE — Telephone Encounter (Signed)
PT DIDN'T KNOW IF SHE NEED TO SEE CHELLE OR DO SHE JUST COME IN FOR HER BLOOD WORK, THE PHARMACY IS GETTING IN TOUCH WITH [place] REGARDING HER MEDS PLEASE CALL PT AT [phone]

## 2013-03-10 ENCOUNTER — Other Ambulatory Visit: Payer: Self-pay | Admitting: Physician Assistant

## 2013-03-14 ENCOUNTER — Encounter: Payer: Self-pay | Admitting: Physician Assistant

## 2013-03-14 ENCOUNTER — Ambulatory Visit (INDEPENDENT_AMBULATORY_CARE_PROVIDER_SITE_OTHER): Payer: Commercial Managed Care - PPO | Admitting: Physician Assistant

## 2013-03-14 VITALS — BP 130/77 | HR 78 | Temp 97.6°F | Resp 16 | Ht 62.5 in | Wt 164.0 lb

## 2013-03-14 DIAGNOSIS — E785 Hyperlipidemia, unspecified: Secondary | ICD-10-CM

## 2013-03-14 DIAGNOSIS — R7309 Other abnormal glucose: Secondary | ICD-10-CM

## 2013-03-14 DIAGNOSIS — M8580 Other specified disorders of bone density and structure, unspecified site: Secondary | ICD-10-CM

## 2013-03-14 DIAGNOSIS — Z23 Encounter for immunization: Secondary | ICD-10-CM

## 2013-03-14 DIAGNOSIS — R739 Hyperglycemia, unspecified: Secondary | ICD-10-CM

## 2013-03-14 DIAGNOSIS — E039 Hypothyroidism, unspecified: Secondary | ICD-10-CM

## 2013-03-14 DIAGNOSIS — M899 Disorder of bone, unspecified: Secondary | ICD-10-CM

## 2013-03-14 DIAGNOSIS — I10 Essential (primary) hypertension: Secondary | ICD-10-CM

## 2013-03-14 LAB — COMPREHENSIVE METABOLIC PANEL
AST: 25 U/L (ref 0–37)
Albumin: 4.2 g/dL (ref 3.5–5.2)
Alkaline Phosphatase: 92 U/L (ref 39–117)
BUN: 13 mg/dL (ref 6–23)
Chloride: 102 mEq/L (ref 96–112)
Creat: 0.83 mg/dL (ref 0.50–1.10)
Glucose, Bld: 96 mg/dL (ref 70–99)
Total Bilirubin: 0.6 mg/dL (ref 0.3–1.2)

## 2013-03-14 LAB — CBC WITH DIFFERENTIAL/PLATELET
Basophils Absolute: 0 10*3/uL (ref 0.0–0.1)
Basophils Relative: 1 % (ref 0–1)
HCT: 40.2 % (ref 36.0–46.0)
Lymphocytes Relative: 30 % (ref 12–46)
MCHC: 34.8 g/dL (ref 30.0–36.0)
Neutro Abs: 4.8 10*3/uL (ref 1.7–7.7)
Neutrophils Relative %: 61 % (ref 43–77)
Platelets: 265 10*3/uL (ref 150–400)
RDW: 13.7 % (ref 11.5–15.5)
WBC: 7.7 10*3/uL (ref 4.0–10.5)

## 2013-03-14 LAB — LIPID PANEL
Cholesterol: 129 mg/dL (ref 0–200)
HDL: 39 mg/dL — ABNORMAL LOW (ref >39)
LDL Cholesterol: 57 mg/dL (ref 0–99)
Total CHOL/HDL Ratio: 3.3 Ratio
Triglycerides: 167 mg/dL — ABNORMAL HIGH (ref <150)
VLDL: 33 mg/dL (ref 0–40)

## 2013-03-14 NOTE — Patient Instructions (Signed)
Please work on healthy eating and getting regular exercise!

## 2013-03-14 NOTE — Progress Notes (Signed)
   Subjective:    Patient ID: Erika Mason, female    DOB: 09-02-1945, 67 y.o.   MRN: [id number]  Chief Complaint  Patient presents with  . Medication Refill   Medications, allergies, past medical history, surgical history, family history, social history and problem list reviewed and updated.  Patient Active Problem List   Diagnosis Date Noted  . HTN (hypertension) 08/14/2012  . Hyperlipidemia 08/14/2012  . Hypothyroidism 08/14/2012  . GERD (gastroesophageal reflux disease) 08/14/2012  . Lingular mass 08/14/2012  . Hyperglycemia 08/14/2012  . Edema   . Low bone mass   . History of colon polyps   . Vitamin D insufficiency   . Nephrolithiasis   . Diverticulosis      HPI  Last ate about 11:30 am, a package of Peaches and Cream instant oatmeal. Feels good overall. No questions or concerns today-she's only here because we told her she was due for a follow-up.  Review of Systems Loose stools once in a while, attributed to lack of gallbladder. Otherwise NEGATIVE.    Objective:   Physical Exam  Blood pressure 130/77, pulse 78, temperature 97.6 F (36.4 C), resp. rate 16, height 5' 2.5" (1.588 m), weight 164 lb (74.39 kg). Body mass index is 29.5 kg/(m^2). Well-developed, well nourished WF who is awake, alert and oriented, in NAD. HEENT: [place]/AT, sclera and conjunctiva are clear.   Neck: supple, non-tender, no lymphadenopathy, thyromegaly. Heart: RRR, no murmur Lungs: normal effort, CTA Extremities: no cyanosis, clubbing or edema. Skin: warm and dry without rash. Psychologic: good mood and appropriate affect, normal speech and behavior.       Assessment & Plan:  1. HTN (hypertension) Controlled. - CBC with Differential  2. Hypothyroidism Await labs. - TSH  3. Hyperlipidemia Await labs. - Lipid panel  4. Hyperglycemia A1C has been <6% - Comprehensive metabolic panel  5. Low bone mass She declines follow-up DEXA as she refuses additional treatment for  low bone mass/osteoporosis.  6. Need for pneumococcal vaccine - Pneumococcal polysaccharide vaccine 23-valent greater than or equal to 2yo subcutaneous/IM  Fernande Bras, PA-C Physician Assistant-Certified [facility]

## 2013-05-09 ENCOUNTER — Other Ambulatory Visit: Payer: Self-pay | Admitting: Physician Assistant

## 2013-07-18 ENCOUNTER — Other Ambulatory Visit: Payer: Self-pay

## 2013-07-24 ENCOUNTER — Encounter: Payer: Self-pay | Admitting: Internal Medicine

## 2013-08-05 ENCOUNTER — Other Ambulatory Visit: Payer: Self-pay | Admitting: Physician Assistant

## 2013-08-14 ENCOUNTER — Other Ambulatory Visit: Payer: Self-pay | Admitting: Physician Assistant

## 2013-09-12 ENCOUNTER — Encounter: Payer: Self-pay | Admitting: Physician Assistant

## 2013-09-12 ENCOUNTER — Ambulatory Visit (INDEPENDENT_AMBULATORY_CARE_PROVIDER_SITE_OTHER): Payer: Commercial Managed Care - PPO | Admitting: Physician Assistant

## 2013-09-12 VITALS — BP 126/64 | HR 72 | Temp 98.0°F | Resp 16 | Ht 62.0 in | Wt 163.0 lb

## 2013-09-12 DIAGNOSIS — I10 Essential (primary) hypertension: Secondary | ICD-10-CM

## 2013-09-12 DIAGNOSIS — R059 Cough, unspecified: Secondary | ICD-10-CM

## 2013-09-12 DIAGNOSIS — E785 Hyperlipidemia, unspecified: Secondary | ICD-10-CM

## 2013-09-12 DIAGNOSIS — R05 Cough: Secondary | ICD-10-CM

## 2013-09-12 DIAGNOSIS — E119 Type 2 diabetes mellitus without complications: Secondary | ICD-10-CM

## 2013-09-12 DIAGNOSIS — R7309 Other abnormal glucose: Secondary | ICD-10-CM

## 2013-09-12 DIAGNOSIS — R739 Hyperglycemia, unspecified: Secondary | ICD-10-CM

## 2013-09-12 LAB — GLUCOSE, POCT (MANUAL RESULT ENTRY): POC Glucose: 121 mg/dl — AB (ref 70–99)

## 2013-09-12 LAB — POCT GLYCOSYLATED HEMOGLOBIN (HGB A1C): Hemoglobin A1C: 7.3

## 2013-09-12 MED ORDER — GUAIFENESIN ER 1200 MG PO TB12: 1.0000 | ORAL_TABLET | Freq: Two times a day (BID) | ORAL | Status: DC | PRN

## 2013-09-12 MED ORDER — ALBUTEROL SULFATE HFA 108 (90 BASE) MCG/ACT IN AERS: 2.0000 | INHALATION_SPRAY | RESPIRATORY_TRACT | Status: DC | PRN

## 2013-09-12 MED ORDER — AZITHROMYCIN 250 MG PO TABS: ORAL_TABLET | ORAL | Status: DC

## 2013-09-12 MED ORDER — METFORMIN HCL 500 MG PO TABS: 500.0000 mg | ORAL_TABLET | Freq: Two times a day (BID) | ORAL | Status: DC

## 2013-09-12 MED ORDER — IPRATROPIUM BROMIDE 0.03 % NA SOLN: 2.0000 | Freq: Two times a day (BID) | NASAL | Status: DC

## 2013-09-12 NOTE — Progress Notes (Signed)
Subjective:    Patient ID: [name], female    DOB: 03-Jun-1945, 68 y.o.   MRN: [id number]   PCP: Keishana Klinger, PA-C  Chief Complaint  Patient presents with  . Follow-up    triglycerides   Medications, allergies, past medical history, surgical history, family history, social history and problem list reviewed and updated.  Patient Active Problem List   Diagnosis Date Noted  . HTN (hypertension) 08/14/2012  . Hyperlipidemia 08/14/2012  . Hypothyroidism 08/14/2012  . GERD (gastroesophageal reflux disease) 08/14/2012  . Lingular mass 08/14/2012  . Hyperglycemia 08/14/2012  . Edema   . Low bone mass   . History of colon polyps   . Vitamin D insufficiency   . Nephrolithiasis   . Diverticulosis    Prior to Admission medications   Medication Sig Start Date End Date Taking? Authorizing Provider  atorvastatin (LIPITOR) 40 MG tablet TAKE 1 TABLET BY MOUTH EVERY DAY 08/05/13  Yes Chassidy Layson S Jakerria Kingbird, PA-C  hydrochlorothiazide (HYDRODIURIL) 25 MG tablet Take 1 tablet (25 mg total) by mouth daily. 11/09/12  Yes Heather M Marte, PA-C  levothyroxine (SYNTHROID, LEVOTHROID) 88 MCG tablet Take 1 tablet (88 mcg total) by mouth daily. 08/14/12  Yes Tamario Heal S Nicasio Barlowe, PA-C  pantoprazole (PROTONIX) 40 MG tablet Take 1 tablet (40 mg total) by mouth daily. 08/14/12 01/14/14 Yes Derron Pipkins Janalee Dane, PA-C    HPI  "I think I have bronchitis.  I started feeling pretty rotten last Friday. I rested over the weekend, and then went back to work.  The cough was getting worse.  I think it started out as sinus, and I was taking stuff for sinus.  But now it feels like it's chest."  The cough is non-productive. Sinus pressure has resolved.  "I can hear myself wheezing when I lie down at night." General malaise/fatigue. No CP, SOB, HA, dizziness, N/V. No unexplained myalgias, arthralgias or rash.  Review of Systems As above.    Objective:   Physical Exam  Constitutional: She is oriented to person, place,  and time. No distress.  BP 126/64  Pulse 72  Temp(Src) 98 F (36.7 C) (Oral)  Resp 16  Ht 5\' 2"  (1.575 m)  Wt 163 lb (73.936 kg)  BMI 29.81 kg/m2  SpO2 94%   HENT:  Head: Normocephalic and atraumatic.  Right Ear: External ear normal.  Left Ear: External ear normal.  Nose: Nose normal.  Mouth/Throat: Oropharynx is clear and moist. No oropharyngeal exudate.  Eyes: Conjunctivae are normal. No scleral icterus.  Neck: Normal range of motion. Neck supple. No thyromegaly present.  Cardiovascular: Normal rate, regular rhythm and normal heart sounds.   Pulmonary/Chest: Effort normal. No respiratory distress. She has wheezes (diffuse, soft). She has no rales. She exhibits no tenderness.  Lymphadenopathy:    She has no cervical adenopathy.  Neurological: She is alert and oriented to person, place, and time.  Skin: Skin is warm and dry.  Psychiatric: She has a normal mood and affect. Her behavior is normal.      Results for orders placed in visit on 09/12/13  GLUCOSE, POCT (MANUAL RESULT ENTRY)      Result Value Ref Range   POC Glucose 121 (*) 70 - 99 mg/dl  POCT GLYCOSYLATED HEMOGLOBIN (HGB A1C)      Result Value Ref Range   Hemoglobin A1C 7.3         Assessment & Plan:  1. Hyperlipidemia Await labs.  Continue current treatment. - Comprehensive metabolic panel - Lipid panel  2. HTN (hypertension) Controlled.  Continue current treatment.  3. Cough Bronchitis. Azithromycin, Atrovent NS, Albuterol inhaler, Mucinex Maximum Strength.  Rest, fluids.  4. Hyperglycemia 5. Diabetes mellitus, type 2 A1C now >7%. Start Metformin.  Healthier eating and regular exercise. - POCT glucose (manual entry) - POCT glycosylated hemoglobin (Hb A1C) - metFORMIN (GLUCOPHAGE) 500 MG tablet; Take 1 tablet (500 mg total) by mouth 2 (two) times daily with a meal.  Dispense: 180 tablet; Refill: 3  Return in about 3 months (around 12/13/2013).    Fara Chute, PA-C Physician  Assistant-Certified [facility]

## 2013-09-12 NOTE — Patient Instructions (Signed)
Your blood sugar has gone up-progressed to Diabetes. Please start metformin, 500 mg one time each day for a week, then increase to twice daily.

## 2013-09-13 LAB — LIPID PANEL
CHOL/HDL RATIO: 3.5 ratio
CHOLESTEROL: 140 mg/dL (ref 0–200)
HDL: 40 mg/dL (ref >39)
LDL Cholesterol: 77 mg/dL (ref 0–99)
Triglycerides: 114 mg/dL (ref <150)
VLDL: 23 mg/dL (ref 0–40)

## 2013-09-13 LAB — COMPREHENSIVE METABOLIC PANEL
ALT: 36 U/L — ABNORMAL HIGH (ref 0–35)
AST: 23 U/L (ref 0–37)
Albumin: 4.4 g/dL (ref 3.5–5.2)
Alkaline Phosphatase: 98 U/L (ref 39–117)
BILIRUBIN TOTAL: 0.9 mg/dL (ref 0.2–1.2)
BUN: 13 mg/dL (ref 6–23)
CO2: 28 mEq/L (ref 19–32)
Calcium: 9.6 mg/dL (ref 8.4–10.5)
Chloride: 99 mEq/L (ref 96–112)
Creat: 0.94 mg/dL (ref 0.50–1.10)
Glucose, Bld: 111 mg/dL — ABNORMAL HIGH (ref 70–99)
Potassium: 3.5 mEq/L (ref 3.5–5.3)
Sodium: 140 mEq/L (ref 135–145)
Total Protein: 6.9 g/dL (ref 6.0–8.3)

## 2013-09-16 ENCOUNTER — Other Ambulatory Visit: Payer: Self-pay | Admitting: Physician Assistant

## 2013-11-06 ENCOUNTER — Other Ambulatory Visit: Payer: Self-pay | Admitting: Physician Assistant

## 2013-11-26 ENCOUNTER — Other Ambulatory Visit: Payer: Self-pay | Admitting: Physician Assistant

## 2013-12-05 ENCOUNTER — Other Ambulatory Visit: Payer: Self-pay | Admitting: Physician Assistant

## 2013-12-17 ENCOUNTER — Ambulatory Visit: Payer: Commercial Managed Care - PPO | Admitting: Physician Assistant

## 2014-01-20 ENCOUNTER — Encounter: Payer: Self-pay | Admitting: Internal Medicine

## 2014-01-23 ENCOUNTER — Telehealth: Payer: Self-pay

## 2014-01-23 ENCOUNTER — Ambulatory Visit (INDEPENDENT_AMBULATORY_CARE_PROVIDER_SITE_OTHER): Payer: Commercial Managed Care - HMO | Admitting: Family Medicine

## 2014-01-23 VITALS — BP 120/72 | HR 109 | Temp 99.0°F | Resp 18 | Ht 63.0 in | Wt 152.0 lb

## 2014-01-23 DIAGNOSIS — N3090 Cystitis, unspecified without hematuria: Secondary | ICD-10-CM

## 2014-01-23 DIAGNOSIS — R8290 Unspecified abnormal findings in urine: Secondary | ICD-10-CM

## 2014-01-23 LAB — POCT URINALYSIS DIPSTICK
BILIRUBIN UA: NEGATIVE
Glucose, UA: NEGATIVE
KETONES UA: NEGATIVE
Nitrite, UA: NEGATIVE
PH UA: 8
Protein, UA: 100
SPEC GRAV UA: 1.015
Urobilinogen, UA: 0.2

## 2014-01-23 LAB — POCT UA - MICROSCOPIC ONLY
CASTS, UR, LPF, POC: NEGATIVE
Crystals, Ur, HPF, POC: NEGATIVE
MUCUS UA: NEGATIVE
Yeast, UA: NEGATIVE

## 2014-01-23 MED ORDER — SULFAMETHOXAZOLE-TMP DS 800-160 MG PO TABS: 1.0000 | ORAL_TABLET | Freq: Two times a day (BID) | ORAL | Status: DC

## 2014-01-23 NOTE — Telephone Encounter (Signed)
Advised pt she will need to come in. Pt will come in today to see Chelle.

## 2014-01-23 NOTE — Telephone Encounter (Signed)
Patient has noticed mucus in her urine for the last week. Patient is requesting Erika Mason to call her in a prescription. I informed patient she may be requested to come in an be evaluated. [facility] [address]. Patients call back number [phone]

## 2014-01-23 NOTE — Patient Instructions (Signed)
Start antibiotic - twice per day for 5 days. Fluids, other treatments as discussed below.return if any new symptoms or worsening  (Return to the clinic or go to the nearest emergency room if any of your symptoms worsen or new symptoms occur).   Urinary Tract Infection Urinary tract infections (UTIs) can develop anywhere along your urinary tract. Your urinary tract is your body's drainage system for removing wastes and extra water. Your urinary tract includes two kidneys, two ureters, a bladder, and a urethra. Your kidneys are a pair of bean-shaped organs. Each kidney is about the size of your fist. They are located below your ribs, one on each side of your spine. CAUSES Infections are caused by microbes, which are microscopic organisms, including fungi, viruses, and bacteria. These organisms are so small that they can only be seen through a microscope. Bacteria are the microbes that most commonly cause UTIs. SYMPTOMS  Symptoms of UTIs may vary by age and gender of the patient and by the location of the infection. Symptoms in young women typically include a frequent and intense urge to urinate and a painful, burning feeling in the bladder or urethra during urination. Older women and men are more likely to be tired, shaky, and weak and have muscle aches and abdominal pain. A fever may mean the infection is in your kidneys. Other symptoms of a kidney infection include pain in your back or sides below the ribs, nausea, and vomiting. DIAGNOSIS To diagnose a UTI, your caregiver will ask you about your symptoms. Your caregiver also will ask to provide a urine sample. The urine sample will be tested for bacteria and white blood cells. White blood cells are made by your body to help fight infection. TREATMENT  Typically, UTIs can be treated with medication. Because most UTIs are caused by a bacterial infection, they usually can be treated with the use of antibiotics. The choice of antibiotic and length of  treatment depend on your symptoms and the type of bacteria causing your infection. HOME CARE INSTRUCTIONS  If you were prescribed antibiotics, take them exactly as your caregiver instructs you. Finish the medication even if you feel better after you have only taken some of the medication.  Drink enough water and fluids to keep your urine clear or pale yellow.  Avoid caffeine, tea, and carbonated beverages. They tend to irritate your bladder.  Empty your bladder often. Avoid holding urine for long periods of time.  Empty your bladder before and after sexual intercourse.  After a bowel movement, women should cleanse from front to back. Use each tissue only once. SEEK MEDICAL CARE IF:   You have back pain.  You develop a fever.  Your symptoms do not begin to resolve within 3 days. SEEK IMMEDIATE MEDICAL CARE IF:   You have severe back pain or lower abdominal pain.  You develop chills.  You have nausea or vomiting.  You have continued burning or discomfort with urination. MAKE SURE YOU:   Understand these instructions.  Will watch your condition.  Will get help right away if you are not doing well or get worse. Document Released: 01/05/2005 Document Revised: 09/27/2011 Document Reviewed: 05/06/2011 [employer] [facility] Patient Information 2015 [place], [place]. This information is not intended to replace advice given to you by your health care provider. Make sure you discuss any questions you have with your health care provider.

## 2014-01-23 NOTE — Telephone Encounter (Signed)
Tried to call pt back. No answer or machine.

## 2014-01-23 NOTE — Progress Notes (Signed)
Subjective:  This chart was scribed for Erika Ray, MD by Erling Conte, Medical Scribe. This patient was seen in Room 5 and the patient's care was started at 4:41 PM.   Patient ID: Erika Mason, female    DOB: 12/15/1945, 68 y.o.   MRN: [id number]  HPI HPI Comments: [name] is a 68 y.o. female who presents to the [facility] complaining of intermittent mucous in her urine for 1 week. Pt states that she has been noticing it every once in a while when she goes to the bathroom. She has no other associated urinary symptoms. Pt has not taken an medication. No h/o bladder surgeries. No h/o kidney issues. Never had these symptoms before. She denies any dysuria, urinary frequency, hematuria, vaginal discharge, no rash around vagina, no flank pain, back pain, fever, chills, or abdominal pain.   PCP: JEFFERY,CHELLE, PA-C  Patient Active Problem List   Diagnosis Date Noted  . HTN (hypertension) 08/14/2012  . Hyperlipidemia 08/14/2012  . Hypothyroidism 08/14/2012  . GERD (gastroesophageal reflux disease) 08/14/2012  . Lingular mass 08/14/2012  . Hyperglycemia 08/14/2012  . Edema   . Low bone mass   . History of colon polyps   . Vitamin D insufficiency   . Nephrolithiasis   . Diverticulosis    Past Medical History  Diagnosis Date  . Hypertension   . Hyperlipidemia   . Thyroid disease   . Cough   . Edema   . GERD (gastroesophageal reflux disease)     Barrett's Esophagus  . Low bone mass 2008  . History of colon polyps 2009, 2012  . Vitamin D insufficiency     04/2009  . Diabetes mellitus without complication   . BCC (basal cell carcinoma), face     nasolabial fold, s/p MOHS  . Nephrolithiasis     staghorn  . Cholelithiasis   . Diverticulosis    Past Surgical History  Procedure Laterality Date  . Vaginal hysterectomy    . Parathyroidectomy    . Cataract extraction, bilateral    . Mohs surgery      nasolabial fold  . Cholecystectomy   2012   Allergies  Allergen Reactions  . Actonel [Risedronate Sodium]     Chest Pain/Severe GERD   Prior to Admission medications   Medication Sig Start Date End Date Taking? Authorizing Provider  atorvastatin (LIPITOR) 40 MG tablet TAKE 1 TABLET BY MOUTH EVERY DAY   Yes Chelle S Jeffery, PA-C  hydrochlorothiazide (HYDRODIURIL) 25 MG tablet TAKE 1 TABLET BY MOUTH EVERY DAY 11/27/13  Yes Chelle S Jeffery, PA-C  levothyroxine (SYNTHROID, LEVOTHROID) 88 MCG tablet TAKE 1 TABLET BY MOUTH DAILY. 09/16/13  Yes Heather Elnora Morrison, PA-C  metFORMIN (GLUCOPHAGE) 500 MG tablet Take 1 tablet (500 mg total) by mouth 2 (two) times daily with a meal. 09/12/13  Yes Chelle S Jeffery, PA-C  pantoprazole (PROTONIX) 40 MG tablet TAKE 1 TABLET BY MOUTH EVERY DAY   Yes Chelle S Jeffery, PA-C  albuterol (PROAIR HFA) 108 (90 BASE) MCG/ACT inhaler Inhale 2 puffs into the lungs every 4 (four) hours as needed. PATIENT NEEDS OFFICE VISIT FOR ADDITIONAL REFILLS 12/05/13   Fara Chute, PA-C  azithromycin (ZITHROMAX) 250 MG tablet Take 2 tabs PO x 1 dose, then 1 tab PO QD x 4 days 09/12/13   Chelle S Jeffery, PA-C  Guaifenesin (MUCINEX MAXIMUM STRENGTH) 1200 MG TB12 Take 1 tablet (1,200 mg total) by mouth every 12 (twelve) hours as needed.  09/12/13   Chelle S Jeffery, PA-C  ipratropium (ATROVENT) 0.03 % nasal spray Place 2 sprays into both nostrils 2 (two) times daily. 09/12/13   Fara Chute, PA-C   History   Social History  . Marital Status: Single    Spouse Name: n/a    Number of Children: 0  . Years of Education: 10   Occupational History  . insurance    Social History Main Topics  . Smoking status: Former Smoker -- 0.80 packs/day for 50 years    Types: Cigarettes    Quit date: 04/16/2012  . Smokeless tobacco: Never Used  . Alcohol Use: No  . Drug Use: No  . Sexual Activity: Not on file   Other Topics Concern  . Not on file   Social History Narrative   Lives with her brother.    Review of Systems    Constitutional: Negative for fever and chills.  Gastrointestinal: Negative for abdominal pain.  Genitourinary: Negative for dysuria, frequency, hematuria, flank pain, vaginal discharge and difficulty urinating.       + mucous in urine  Musculoskeletal: Negative for back pain.  Skin: Negative for rash.  All other systems reviewed and are negative.      Objective:   Physical Exam  Nursing note and vitals reviewed. Constitutional: She is oriented to person, place, and time. She appears well-developed and well-nourished. No distress.  HENT:  Head: Normocephalic and atraumatic.  Eyes: Conjunctivae and EOM are normal.  Neck: Neck supple. No tracheal deviation present.  Cardiovascular: Normal rate, regular rhythm and normal heart sounds.   Pulmonary/Chest: Effort normal and breath sounds normal. No respiratory distress. She has no wheezes. She has no rales.  Abdominal: Soft. There is no tenderness. There is no rebound, no guarding and no CVA tenderness.  Musculoskeletal: Normal range of motion.  Neurological: She is alert and oriented to person, place, and time.  Skin: Skin is warm and dry.  Psychiatric: She has a normal mood and affect. Her behavior is normal.     Filed Vitals:   01/23/14 1601  BP: 120/72  Pulse: 109  Temp: 99 F (37.2 C)  TempSrc: Oral  Resp: 18  Height: 5\' 3"  (1.6 m)  Weight: 152 lb (68.947 kg)  SpO2: 94%    Results for orders placed in visit on 01/23/14  POCT URINALYSIS DIPSTICK      Result Value Ref Range   Color, UA yellow     Clarity, UA clear     Glucose, UA neg     Bilirubin, UA neg     Ketones, UA neg     Spec Grav, UA 1.015     Blood, UA large     pH, UA 8.0     Protein, UA 100     Urobilinogen, UA 0.2     Nitrite, UA neg     Leukocytes, UA large (3+)    POCT UA - MICROSCOPIC ONLY      Result Value Ref Range   WBC, Ur, HPF, POC 10-20     RBC, urine, microscopic 5-10     Bacteria, U Microscopic trace     Mucus, UA neg     Epithelial  cells, urine per micros 0-3     Crystals, Ur, HPF, POC neg     Casts, Ur, LPF, POC neg     Yeast, UA neg         Assessment & Plan:    [name] is a 68  y.o. female Abnormal urine - Plan: POCT urinalysis dipstick, POCT UA - Microscopic Only, Urine culture  Cystitis - Plan: Urine culture, sulfamethoxazole-trimethoprim (BACTRIM DS) 800-160 MG per tablet  UTI.  Start Septra DS, push fluids, RTC precautions discussed including if any worsening, persistence of mucus in urine or new side effects of antibiotics or any clinical worsening  - understanding expressed. Urine cx pending.  Meds ordered this encounter  Medications  . sulfamethoxazole-trimethoprim (BACTRIM DS) 800-160 MG per tablet    Sig: Take 1 tablet by mouth 2 (two) times daily.    Dispense:  10 tablet    Refill:  0   Patient Instructions  Start antibiotic - twice per day for 5 days. Fluids, other treatments as discussed below.return if any new symptoms or worsening  (Return to the clinic or go to the nearest emergency room if any of your symptoms worsen or new symptoms occur).   Urinary Tract Infection Urinary tract infections (UTIs) can develop anywhere along your urinary tract. Your urinary tract is your body's drainage system for removing wastes and extra water. Your urinary tract includes two kidneys, two ureters, a bladder, and a urethra. Your kidneys are a pair of bean-shaped organs. Each kidney is about the size of your fist. They are located below your ribs, one on each side of your spine. CAUSES Infections are caused by microbes, which are microscopic organisms, including fungi, viruses, and bacteria. These organisms are so small that they can only be seen through a microscope. Bacteria are the microbes that most commonly cause UTIs. SYMPTOMS  Symptoms of UTIs may vary by age and gender of the patient and by the location of the infection. Symptoms in young women typically include a frequent and intense urge to  urinate and a painful, burning feeling in the bladder or urethra during urination. Older women and men are more likely to be tired, shaky, and weak and have muscle aches and abdominal pain. A fever may mean the infection is in your kidneys. Other symptoms of a kidney infection include pain in your back or sides below the ribs, nausea, and vomiting. DIAGNOSIS To diagnose a UTI, your caregiver will ask you about your symptoms. Your caregiver also will ask to provide a urine sample. The urine sample will be tested for bacteria and white blood cells. White blood cells are made by your body to help fight infection. TREATMENT  Typically, UTIs can be treated with medication. Because most UTIs are caused by a bacterial infection, they usually can be treated with the use of antibiotics. The choice of antibiotic and length of treatment depend on your symptoms and the type of bacteria causing your infection. HOME CARE INSTRUCTIONS  If you were prescribed antibiotics, take them exactly as your caregiver instructs you. Finish the medication even if you feel better after you have only taken some of the medication.  Drink enough water and fluids to keep your urine clear or pale yellow.  Avoid caffeine, tea, and carbonated beverages. They tend to irritate your bladder.  Empty your bladder often. Avoid holding urine for long periods of time.  Empty your bladder before and after sexual intercourse.  After a bowel movement, women should cleanse from front to back. Use each tissue only once. SEEK MEDICAL CARE IF:   You have back pain.  You develop a fever.  Your symptoms do not begin to resolve within 3 days. SEEK IMMEDIATE MEDICAL CARE IF:   You have severe back pain or lower abdominal pain.  You develop chills.  You have nausea or vomiting.  You have continued burning or discomfort with urination. MAKE SURE YOU:   Understand these instructions.  Will watch your condition.  Will get help right  away if you are not doing well or get worse. Document Released: 01/05/2005 Document Revised: 09/27/2011 Document Reviewed: 05/06/2011 [facility] Patient Information 2015 [place], [place]. This information is not intended to replace advice given to you by your health care provider. Make sure you discuss any questions you have with your health care provider.      I personally performed the services described in this documentation, which was scribed in my presence. The recorded information has been reviewed and considered, and addended by me as needed.

## 2014-01-26 LAB — URINE CULTURE

## 2014-01-27 ENCOUNTER — Telehealth: Payer: Self-pay

## 2014-01-27 DIAGNOSIS — R739 Hyperglycemia, unspecified: Secondary | ICD-10-CM

## 2014-01-27 NOTE — Telephone Encounter (Signed)
Pt requesting refills on all her current medications be faxed to [facility] mail order at  [phone]     Best phone for pt is [phone]

## 2014-01-28 ENCOUNTER — Telehealth: Payer: Self-pay | Admitting: Radiology

## 2014-01-28 MED ORDER — LEVOTHYROXINE SODIUM 88 MCG PO TABS: ORAL_TABLET | ORAL | Status: DC

## 2014-01-28 MED ORDER — PANTOPRAZOLE SODIUM 40 MG PO TBEC: DELAYED_RELEASE_TABLET | ORAL | Status: DC

## 2014-01-28 MED ORDER — HYDROCHLOROTHIAZIDE 25 MG PO TABS: ORAL_TABLET | ORAL | Status: DC

## 2014-01-28 MED ORDER — ATORVASTATIN CALCIUM 40 MG PO TABS: ORAL_TABLET | ORAL | Status: DC

## 2014-01-28 MED ORDER — METFORMIN HCL 500 MG PO TABS: 500.0000 mg | ORAL_TABLET | Freq: Two times a day (BID) | ORAL | Status: DC

## 2014-01-28 NOTE — Telephone Encounter (Signed)
Verified w/pt which meds were needed and sent in 90 day supplies to [facility]. I advised pt that she would be due for f/up visit before they ran out and she questioned why she would need to come in so soon, bc she was just here. I explained that she was here for acute issue and not for her chronic Dxs/meds but told her that I would check with Chelle and see when she wants to see her back for check up. Chelle, please advise.

## 2014-01-29 NOTE — Telephone Encounter (Signed)
Yes, I need to see her and update her labs-they were last checked in June. Plan to do it in December.

## 2014-01-29 NOTE — Telephone Encounter (Signed)
Left message on machine to call back  

## 2014-01-29 NOTE — Telephone Encounter (Signed)
Pt.notified

## 2014-03-27 ENCOUNTER — Telehealth: Payer: Self-pay

## 2014-03-27 NOTE — Telephone Encounter (Signed)
Spoke to patient regarding flu shot.  Patient states she is not getting flu shot this year.

## 2014-04-09 ENCOUNTER — Ambulatory Visit (INDEPENDENT_AMBULATORY_CARE_PROVIDER_SITE_OTHER): Payer: Commercial Managed Care - HMO | Admitting: Family Medicine

## 2014-04-09 VITALS — BP 120/70 | HR 84 | Temp 98.4°F | Resp 18 | Ht 63.0 in | Wt 152.4 lb

## 2014-04-09 DIAGNOSIS — R739 Hyperglycemia, unspecified: Secondary | ICD-10-CM

## 2014-04-09 DIAGNOSIS — R319 Hematuria, unspecified: Secondary | ICD-10-CM

## 2014-04-09 LAB — POCT UA - MICROSCOPIC ONLY
Casts, Ur, LPF, POC: NEGATIVE
Mucus, UA: NEGATIVE
Yeast, UA: NEGATIVE

## 2014-04-09 LAB — POCT URINALYSIS DIPSTICK
BILIRUBIN UA: NEGATIVE
Glucose, UA: NEGATIVE
KETONES UA: NEGATIVE
Nitrite, UA: NEGATIVE
Protein, UA: 30
Spec Grav, UA: 1.01
Urobilinogen, UA: 0.2
pH, UA: 7.5

## 2014-04-09 LAB — COMPREHENSIVE METABOLIC PANEL
ALBUMIN: 4 g/dL (ref 3.5–5.2)
ALT: 17 U/L (ref 0–35)
AST: 15 U/L (ref 0–37)
Alkaline Phosphatase: 111 U/L (ref 39–117)
BUN: 9 mg/dL (ref 6–23)
CHLORIDE: 101 meq/L (ref 96–112)
CO2: 29 mEq/L (ref 19–32)
Calcium: 9.3 mg/dL (ref 8.4–10.5)
Creat: 0.84 mg/dL (ref 0.50–1.10)
Glucose, Bld: 118 mg/dL — ABNORMAL HIGH (ref 70–99)
Potassium: 3.5 mEq/L (ref 3.5–5.3)
Sodium: 143 mEq/L (ref 135–145)
Total Bilirubin: 0.8 mg/dL (ref 0.2–1.2)
Total Protein: 6.4 g/dL (ref 6.0–8.3)

## 2014-04-09 LAB — POCT GLYCOSYLATED HEMOGLOBIN (HGB A1C): HEMOGLOBIN A1C: 6.1

## 2014-04-09 LAB — GLUCOSE, POCT (MANUAL RESULT ENTRY): POC Glucose: 137 mg/dl — AB (ref 70–99)

## 2014-04-09 MED ORDER — CIPROFLOXACIN HCL 250 MG PO TABS: 250.0000 mg | ORAL_TABLET | Freq: Two times a day (BID) | ORAL | Status: AC

## 2014-04-09 NOTE — Progress Notes (Signed)
Subjective:    Patient ID: Erika Mason, female    DOB: 1946-01-26, 68 y.o.   MRN: [id number]   PCP: Josiel Gahm, PA-C  Chief Complaint  Patient presents with  . Blood in urine    since yesterday--when wipe she noticed blood--also mucous  . Medication    Patient stated that she has been talking only 1 metform once a day not BID because fast heart beat    Allergies  Allergen Reactions  . Actonel [Risedronate Sodium]     Chest Pain/Severe GERD    Patient Active Problem List   Diagnosis Date Noted  . HTN (hypertension) 08/14/2012  . Hyperlipidemia 08/14/2012  . Hypothyroidism 08/14/2012  . GERD (gastroesophageal reflux disease) 08/14/2012  . Lingular mass 08/14/2012  . Hyperglycemia 08/14/2012  . Edema   . Low bone mass   . History of colon polyps   . Vitamin D insufficiency   . Nephrolithiasis   . Diverticulosis     Prior to Admission medications   Medication Sig Start Date End Date Taking? Authorizing Provider  atorvastatin (LIPITOR) 40 MG tablet TAKE 1 TABLET BY MOUTH EVERY DAY 01/28/14  Yes Sariya Trickey S Jodine Muchmore, PA-C  hydrochlorothiazide (HYDRODIURIL) 25 MG tablet TAKE 1 TABLET BY MOUTH EVERY DAY 01/28/14  Yes Sheryll Dymek S Philmore Lepore, PA-C  levothyroxine (SYNTHROID, LEVOTHROID) 88 MCG tablet TAKE 1 TABLET BY MOUTH DAILY. 01/28/14  Yes Taylour Lietzke S Jadd Gasior, PA-C  metFORMIN (GLUCOPHAGE) 500 MG tablet Take 1 tablet (500 mg total) by mouth 2 (two) times daily with a meal. 01/28/14  Yes Benigna Delisi S Leahann Lempke, PA-C  pantoprazole (PROTONIX) 40 MG tablet TAKE 1 TABLET BY MOUTH EVERY DAY Patient not taking: Reported on 04/09/2014 01/28/14   Fara Chute, PA-C    Medical, Surgical, Family and Social History reviewed and updated.  HPI  Mucous in the urine x 1 week. Also noticed pink on the TP when she wiped after urinating. Was seen here last month with similar symptoms, and treated with Septra DS with complete resolution.  No increase in urinary urgency or frequency. Had one  episode of burning with urination, but none recurrently. Yesterday had some mild RIGHT-sided low back pain, which resolved. No fever, chills, nausea/vomiting. Wonders if this is due to the kidney stone that she has. Has a known staghorn-type renal calculus on the LEFT, noted on CT 01/11/1011.  Reports that she takes the metformin only QD because taking it BID caused palpitations (she clarifies that it's not fast, it just seems like it skips a beat here and there) and dizziness. She's been working hard on healthy eating and regular exercise and is pleased with her weight loss (10 lbs in the past 6 months). Hopes to be able to stop the metformin completely.  Review of Systems As above.    Objective:   Physical Exam  Constitutional: She is oriented to person, place, and time. Vital signs are normal. She appears well-developed and well-nourished. No distress.  BP 120/70 mmHg  Pulse 84  Temp(Src) 98.4 F (36.9 C) (Oral)  Resp 18  Ht 5\' 3"  (1.6 m)  Wt 152 lb 6.4 oz (69.128 kg)  BMI 27.00 kg/m2  SpO2 95%   HENT:  Head: Normocephalic and atraumatic.  Neck: Neck supple. No thyromegaly present.  Cardiovascular: Normal rate, regular rhythm and normal heart sounds.   Pulmonary/Chest: Effort normal and breath sounds normal.  Abdominal: Soft. Normal appearance and bowel sounds are normal. She exhibits no distension and no mass. There is no hepatosplenomegaly. There  is no tenderness. There is no rigidity, no rebound, no guarding, no CVA tenderness, no tenderness at McBurney's point and negative Murphy's sign. No hernia.  Musculoskeletal: Normal range of motion.       Lumbar back: Normal.  Lymphadenopathy:    She has no cervical adenopathy.  Neurological: She is alert and oriented to person, place, and time.  Skin: Skin is warm and dry. No rash noted. She is not diaphoretic. No pallor.  Psychiatric: She has a normal mood and affect. Her speech is normal and behavior is normal. Judgment normal.       Results for orders placed or performed in visit on 04/09/14  POCT UA - Microscopic Only  Result Value Ref Range   WBC, Ur, HPF, POC TNTC    RBC, urine, microscopic TNTC    Bacteria, U Microscopic TRACE    Mucus, UA NEG    Epithelial cells, urine per micros 0-1    Crystals, Ur, HPF, POC RENAL TUBULES    Casts, Ur, LPF, POC NEG    Yeast, UA NEG   POCT urinalysis dipstick  Result Value Ref Range   Color, UA YELLOW    Clarity, UA CLOUDY    Glucose, UA NEG    Bilirubin, UA NEG    Ketones, UA NEG    Spec Grav, UA 1.010    Blood, UA LARGE    pH, UA 7.5    Protein, UA 30    Urobilinogen, UA 0.2    Nitrite, UA NEG    Leukocytes, UA large (3+)   POCT glucose (manual entry)  Result Value Ref Range   POC Glucose 137 (A) 70 - 99 mg/dl  POCT glycosylated hemoglobin (Hb A1C)  Result Value Ref Range   Hemoglobin A1C 6.1        Assessment & Plan:  1. Hematuria This is the second episode of apparent hemorrhagic cystitis in the past month. Treat empirically pending culture and sensitivities and refer to urology. She had normal renal function when last checked (09/2013), but will update today due to reported renal tubular epithelial cells. I doubt she has chronic renal disease or glomerulonephritis. - POCT UA - Microscopic Only - POCT urinalysis dipstick - ciprofloxacin (CIPRO) 250 MG tablet; Take 1 tablet (250 mg total) by mouth 2 (two) times daily.  Dispense: 10 tablet; Refill: 0 - Ambulatory referral to Urology - Urine culture  2. Hyperglycemia A1C is improved. Continue QD metformin and efforts for healthy lifestyle. Recheck in 3 months with fasting lipids. Will D/C metformin if appropriate. - POCT glucose (manual entry) - POCT glycosylated hemoglobin (Hb A1C) - Comprehensive metabolic panel   Fara Chute, PA-C Physician Assistant-Certified [facility]

## 2014-04-09 NOTE — Patient Instructions (Signed)
Continue the metformin, just once each day. Keep working on healthy eating and exercise-I hope we'll be able to stop it!  While you are on the antibiotic, use caution with exercise, as it can increase the risk of tendon injury.

## 2014-04-09 NOTE — Progress Notes (Signed)
Patient discussed with Ms. Jacqulynn Cadet. Agree with assessment and plan of care per her note.

## 2014-04-11 LAB — URINE CULTURE: Colony Count: >=100000

## 2014-05-15 DIAGNOSIS — N200 Calculus of kidney: Secondary | ICD-10-CM | POA: Diagnosis not present

## 2014-05-17 ENCOUNTER — Encounter: Payer: Self-pay | Admitting: Physician Assistant

## 2014-05-17 DIAGNOSIS — N3091 Cystitis, unspecified with hematuria: Secondary | ICD-10-CM | POA: Insufficient documentation

## 2014-05-28 ENCOUNTER — Other Ambulatory Visit: Payer: Self-pay | Admitting: Physician Assistant

## 2014-05-30 DIAGNOSIS — R310 Gross hematuria: Secondary | ICD-10-CM | POA: Diagnosis not present

## 2014-05-30 DIAGNOSIS — N200 Calculus of kidney: Secondary | ICD-10-CM | POA: Diagnosis not present

## 2014-06-03 DIAGNOSIS — N200 Calculus of kidney: Secondary | ICD-10-CM | POA: Diagnosis not present

## 2014-06-03 DIAGNOSIS — N390 Urinary tract infection, site not specified: Secondary | ICD-10-CM | POA: Diagnosis not present

## 2014-06-05 ENCOUNTER — Encounter: Payer: Self-pay | Admitting: Physician Assistant

## 2014-06-05 DIAGNOSIS — I708 Atherosclerosis of other arteries: Secondary | ICD-10-CM

## 2014-06-05 DIAGNOSIS — N200 Calculus of kidney: Secondary | ICD-10-CM

## 2014-06-05 DIAGNOSIS — N3091 Cystitis, unspecified with hematuria: Secondary | ICD-10-CM

## 2014-06-05 DIAGNOSIS — I700 Atherosclerosis of aorta: Secondary | ICD-10-CM

## 2014-06-05 DIAGNOSIS — I7090 Unspecified atherosclerosis: Secondary | ICD-10-CM | POA: Insufficient documentation

## 2014-06-10 ENCOUNTER — Telehealth: Payer: Self-pay

## 2014-06-10 DIAGNOSIS — I700 Atherosclerosis of aorta: Secondary | ICD-10-CM

## 2014-06-10 DIAGNOSIS — I708 Atherosclerosis of other arteries: Secondary | ICD-10-CM

## 2014-06-10 DIAGNOSIS — I10 Essential (primary) hypertension: Secondary | ICD-10-CM

## 2014-06-10 DIAGNOSIS — I7090 Unspecified atherosclerosis: Secondary | ICD-10-CM

## 2014-06-10 NOTE — Telephone Encounter (Signed)
Pam w/Dr Herrick's office ([facility]) called asking whether Chelle has referred pt to cardiologist for eval for upcoming surgery. Stated that [facility] cleared pt for surgery other than recommending cardiologist clearance. Pam stated that w/[employer] ins referral has to be done through PCP. I put in order for cardiologist eval. Chelle, [place]

## 2014-06-23 ENCOUNTER — Other Ambulatory Visit: Payer: Self-pay | Admitting: Urology

## 2014-06-24 ENCOUNTER — Other Ambulatory Visit: Payer: Self-pay | Admitting: Physician Assistant

## 2014-07-08 ENCOUNTER — Ambulatory Visit (INDEPENDENT_AMBULATORY_CARE_PROVIDER_SITE_OTHER): Payer: Commercial Managed Care - HMO | Admitting: Physician Assistant

## 2014-07-08 ENCOUNTER — Encounter: Payer: Self-pay | Admitting: Physician Assistant

## 2014-07-08 ENCOUNTER — Other Ambulatory Visit: Payer: Self-pay | Admitting: Physician Assistant

## 2014-07-08 VITALS — BP 126/79 | HR 77 | Temp 97.9°F | Resp 16 | Ht 62.5 in | Wt 144.0 lb

## 2014-07-08 DIAGNOSIS — E785 Hyperlipidemia, unspecified: Secondary | ICD-10-CM

## 2014-07-08 DIAGNOSIS — R739 Hyperglycemia, unspecified: Secondary | ICD-10-CM | POA: Diagnosis not present

## 2014-07-08 DIAGNOSIS — E559 Vitamin D deficiency, unspecified: Secondary | ICD-10-CM

## 2014-07-08 DIAGNOSIS — I10 Essential (primary) hypertension: Secondary | ICD-10-CM | POA: Diagnosis not present

## 2014-07-08 DIAGNOSIS — F17200 Nicotine dependence, unspecified, uncomplicated: Secondary | ICD-10-CM

## 2014-07-08 DIAGNOSIS — K219 Gastro-esophageal reflux disease without esophagitis: Secondary | ICD-10-CM | POA: Diagnosis not present

## 2014-07-08 DIAGNOSIS — E039 Hypothyroidism, unspecified: Secondary | ICD-10-CM | POA: Diagnosis not present

## 2014-07-08 DIAGNOSIS — Z720 Tobacco use: Secondary | ICD-10-CM | POA: Diagnosis not present

## 2014-07-08 DIAGNOSIS — J302 Other seasonal allergic rhinitis: Secondary | ICD-10-CM | POA: Diagnosis not present

## 2014-07-08 DIAGNOSIS — R7309 Other abnormal glucose: Secondary | ICD-10-CM | POA: Diagnosis not present

## 2014-07-08 LAB — COMPREHENSIVE METABOLIC PANEL
ALBUMIN: 4.3 g/dL (ref 3.5–5.2)
ALT: 17 U/L (ref 0–35)
AST: 13 U/L (ref 0–37)
Alkaline Phosphatase: 96 U/L (ref 39–117)
BUN: 18 mg/dL (ref 6–23)
CALCIUM: 9.9 mg/dL (ref 8.4–10.5)
CO2: 28 mEq/L (ref 19–32)
Chloride: 102 mEq/L (ref 96–112)
Creat: 1.03 mg/dL (ref 0.50–1.10)
Glucose, Bld: 128 mg/dL — ABNORMAL HIGH (ref 70–99)
Potassium: 5 mEq/L (ref 3.5–5.3)
Sodium: 142 mEq/L (ref 135–145)
TOTAL PROTEIN: 7.1 g/dL (ref 6.0–8.3)
Total Bilirubin: 0.6 mg/dL (ref 0.2–1.2)

## 2014-07-08 LAB — CBC WITH DIFFERENTIAL/PLATELET
Basophils Absolute: 0 10*3/uL (ref 0.0–0.1)
Basophils Relative: 0 % (ref 0–1)
Eosinophils Absolute: 0.1 10*3/uL (ref 0.0–0.7)
Eosinophils Relative: 1 % (ref 0–5)
HCT: 44 % (ref 36.0–46.0)
Hemoglobin: 15.2 g/dL — ABNORMAL HIGH (ref 12.0–15.0)
LYMPHS ABS: 2 10*3/uL (ref 0.7–4.0)
Lymphocytes Relative: 18 % (ref 12–46)
MCH: 30.8 pg (ref 26.0–34.0)
MCHC: 34.5 g/dL (ref 30.0–36.0)
MCV: 89.1 fL (ref 78.0–100.0)
MONO ABS: 0.7 10*3/uL (ref 0.1–1.0)
MPV: 10.2 fL (ref 8.6–12.4)
Monocytes Relative: 6 % (ref 3–12)
Neutro Abs: 8.5 10*3/uL — ABNORMAL HIGH (ref 1.7–7.7)
Neutrophils Relative %: 75 % (ref 43–77)
PLATELETS: 268 10*3/uL (ref 150–400)
RBC: 4.94 MIL/uL (ref 3.87–5.11)
RDW: 14.4 % (ref 11.5–15.5)
WBC: 11.3 10*3/uL — ABNORMAL HIGH (ref 4.0–10.5)

## 2014-07-08 LAB — GLUCOSE, POCT (MANUAL RESULT ENTRY): POC Glucose: 155 mg/dl — AB (ref 70–99)

## 2014-07-08 LAB — MICROALBUMIN, URINE: MICROALB UR: 6.7 mg/dL — AB (ref <2.0)

## 2014-07-08 MED ORDER — PANTOPRAZOLE SODIUM 40 MG PO TBEC: DELAYED_RELEASE_TABLET | ORAL | Status: DC

## 2014-07-08 MED ORDER — ALBUTEROL SULFATE HFA 108 (90 BASE) MCG/ACT IN AERS: 2.0000 | INHALATION_SPRAY | RESPIRATORY_TRACT | Status: DC | PRN

## 2014-07-08 NOTE — Progress Notes (Signed)
Patient ID: Erika Mason, female    DOB: 1946/03/20, 69 y.o.   MRN: [id number]  PCP: Lene Mckay, PA-C  Subjective:   Chief Complaint  Patient presents with  . Follow-up  . Hypertension  . Diabetes    HPI Presents for fasting labs to assess cholesterol and hyperglycemia. In addition, she needs some prescription refills.  To see cardiology for clearance on 4/06, and surgery to remove large staghorn renal calculus on 5/06 and 5/13 with Dr. Louis Meckel.  Was dieting, eating less, and had lost weight. She's started eating more "to be nice and healthy" in preparation for her upcoming surgery. She's anxious because the procedure is performed in 2 parts, one week apart, each requiring general anesthesia. Her mother died of renal failure as a complication of multiple anesthesia events. She discussed this with Dr. Louis Meckel at her last visit.  Down to about 1/2 PPD, trying to quit smoking. Less stress with retirement hasn't been as effective as she was expecting. Her sister is finding help with Chantix, but she's leery given the potential side effects, which she says are "silly" given the risks associated with smoking.  Frequency of home glucose monitoring: doesn't check Sees a dentist Q6 months, hasn't seen an eye specialist annually, but is planning to. Her insurance has changed and her current specialists are not in network. Is not current with influenza vaccine. "This is first year that I skipped it. I chickened out." Declines it today, but will get it next year. Is current with pneumovax. Needs Prevnar-13, but declines it today. "Next time sounds better."   Review of Systems Review of Systems  Constitutional: Negative.   HENT: Negative for congestion, dental problem, ear pain and trouble swallowing.   Eyes: Negative.   Respiratory: Negative.   Cardiovascular: Negative.   Gastrointestinal: Negative.   Endocrine: Negative.   Genitourinary: Negative.   Musculoskeletal: Negative.    Skin: Negative.   Allergic/Immunologic: Negative.   Neurological: Negative.   Hematological: Negative.   Psychiatric/Behavioral: Negative for sleep disturbance and dysphoric mood. The patient is nervous/anxious.        Patient Active Problem List   Diagnosis Date Noted  . Atherosclerosis of aorta 06/05/2014  . Atherosclerosis of artery 06/05/2014  . Hemorrhagic cystitis 05/17/2014  . HTN (hypertension) 08/14/2012  . Hyperlipidemia 08/14/2012  . Hypothyroidism 08/14/2012  . GERD (gastroesophageal reflux disease) 08/14/2012  . Lingular mass 08/14/2012  . Hyperglycemia 08/14/2012  . Edema   . Low bone mass   . History of colon polyps   . Vitamin D insufficiency   . Nephrolithiasis   . Diverticulosis      Prior to Admission medications   Medication Sig Start Date End Date Taking? Authorizing Provider  atorvastatin (LIPITOR) 40 MG tablet TAKE 1 TABLET DAILY (NEED CHECK UP FOR ADDITIONAL REFILLS) 06/25/14  Yes Zharia Conrow S Kohei Antonellis, PA-C  hydrochlorothiazide (HYDRODIURIL) 25 MG tablet Take 1 tablet (25 mg total) by mouth daily. PATIENT NEEDS CHECK UP FOR ADDITIONAL REFILLS 05/29/14  Yes Mancel Bale, PA-C  levothyroxine (SYNTHROID, LEVOTHROID) 88 MCG tablet TAKE 1 TABLET BY MOUTH DAILY. 01/28/14  Yes Velvia Mehrer S Carlosdaniel Grob, PA-C  pantoprazole (PROTONIX) 40 MG tablet TAKE 1 TABLET BY MOUTH EVERY DAY 01/28/14  Yes Claudetta Sallie S Natanel Snavely, PA-C  metFORMIN (GLUCOPHAGE) 500 MG tablet Take 1 tablet (500 mg total) by mouth 2 (two) times daily with a meal. Patient not taking: Reported on 07/08/2014 01/28/14   Fara Chute, PA-C     Allergies  Allergen Reactions  .  Actonel [Risedronate Sodium]     Chest Pain/Severe GERD       Objective:  Physical Exam  Physical Exam  Constitutional: She is oriented to person, place, and time. She appears well-developed and well-nourished. No distress.  BP 126/79 mmHg  Pulse 77  Temp(Src) 97.9 F (36.6 C)  Resp 16  Ht 5' 2.5" (1.588 m)  Wt 144 lb (65.318  kg)  BMI 25.90 kg/m2  SpO2 97%   Eyes: Conjunctivae are normal. No scleral icterus.  Neck: Neck supple. No thyromegaly present.  Cardiovascular: Normal rate, regular rhythm, normal heart sounds and intact distal pulses.   Pulmonary/Chest: Effort normal and breath sounds normal.  Lymphadenopathy:    She has no cervical adenopathy.  Neurological: She is alert and oriented to person, place, and time.  Skin: Skin is warm and dry.  Psychiatric: She has a normal mood and affect. Her behavior is normal.    See DM foot exam.  Results for orders placed or performed in visit on 07/08/14  POCT glucose (manual entry)  Result Value Ref Range   POC Glucose 155 (A) 70 - 99 mg/dl       Assessment & Plan:   1. Hyperglycemia Glucose above goal today. Await A1C and other remaining labs. Needs eye and dental exams. Continue metformin. - POCT glucose (manual entry) - Microalbumin, urine - Comprehensive metabolic panel - HM Diabetes Eye Exam - HM Diabetes Foot Exam - Hemoglobin A1c  2. Hyperlipidemia Await lab results. Continue Lipitor 40 mg. Adjust dose if indicated.  3. Essential hypertension Controlled. Continue HCTZ. - CBC with Differential/Platelet  4. Hypothyroidism, unspecified hypothyroidism type Continue current levothyroxine dose. -TSH (add on test-called to [facility] after pick-up)  5. Vitamin D insufficiency Await lab result. May need to resume supplementation. - Vit D  25 hydroxy (rtn osteoporosis monitoring)  6. Gastroesophageal reflux disease, esophagitis presence not specified Controlled on current regimen. - pantoprazole (PROTONIX) 40 MG tablet; TAKE 1 TABLET BY MOUTH EVERY DAY  Dispense: 90 tablet; Refill: 3  7. Smoker Encouraged cessation. She'll continue working on it. - albuterol (PROVENTIL HFA;VENTOLIN HFA) 108 (90 BASE) MCG/ACT inhaler; Inhale 2 puffs into the lungs every 4 (four) hours as needed for wheezing or shortness of breath.  Dispense: 3 Inhaler; Refill:  1  8. Seasonal allergies OTC medications PRN.  Return in about 3 months (around 10/08/2014).  Fara Chute, PA-C Physician Assistant-Certified [facility]

## 2014-07-09 LAB — HEMOGLOBIN A1C
HEMOGLOBIN A1C: 6.6 % — AB (ref <5.7)
Mean Plasma Glucose: 143 mg/dL — ABNORMAL HIGH (ref <117)

## 2014-07-09 LAB — TSH: TSH: 1.385 u[IU]/mL (ref 0.350–4.500)

## 2014-07-09 LAB — VITAMIN D 25 HYDROXY (VIT D DEFICIENCY, FRACTURES): VIT D 25 HYDROXY: 26 ng/mL — AB (ref 30–100)

## 2014-07-16 ENCOUNTER — Encounter: Payer: Self-pay | Admitting: Interventional Cardiology

## 2014-07-16 ENCOUNTER — Ambulatory Visit (INDEPENDENT_AMBULATORY_CARE_PROVIDER_SITE_OTHER): Payer: Commercial Managed Care - HMO | Admitting: Interventional Cardiology

## 2014-07-16 VITALS — BP 128/76 | HR 76 | Ht 62.0 in | Wt 146.0 lb

## 2014-07-16 DIAGNOSIS — Z01810 Encounter for preprocedural cardiovascular examination: Secondary | ICD-10-CM | POA: Diagnosis not present

## 2014-07-16 DIAGNOSIS — Z0000 Encounter for general adult medical examination without abnormal findings: Secondary | ICD-10-CM | POA: Diagnosis not present

## 2014-07-16 DIAGNOSIS — Z720 Tobacco use: Secondary | ICD-10-CM | POA: Diagnosis not present

## 2014-07-16 DIAGNOSIS — I159 Secondary hypertension, unspecified: Secondary | ICD-10-CM | POA: Diagnosis not present

## 2014-07-16 DIAGNOSIS — F17200 Nicotine dependence, unspecified, uncomplicated: Secondary | ICD-10-CM

## 2014-07-16 DIAGNOSIS — I10 Essential (primary) hypertension: Secondary | ICD-10-CM | POA: Diagnosis not present

## 2014-07-16 NOTE — Patient Instructions (Signed)
Your physician recommends that you continue on your current medications as directed. Please refer to the Current Medication list given to you today.  Follow up with Dr. Irish Lack as needed.   Please call our office if you start to have symptoms when walking.

## 2014-07-16 NOTE — Progress Notes (Signed)
Patient ID: Erika Mason, female   DOB: Aug 26, 1945, 69 y.o.   MRN: [id number]     Cardiology Office Note   Date:  07/16/2014   ID:  Estella Husk [place], DOB [birth year], MRN [id number]  PCP:  JEFFERY,CHELLE, PA-C   Dr. Burman Nieves   Chief Complaint  Patient presents with  . pre-surgery visit     Wt Readings from Last 3 Encounters:  07/16/14 146 lb (66.225 kg)  07/08/14 144 lb (65.318 kg)  04/09/14 152 lb 6.4 oz (69.128 kg)       History of Present Illness: Erika Mason is a 69 y.o. female  Who has no cardiac history.  She will need surgery for kidney stones.  She is here for preop evaluation.  She walks during normal housework and does occasional walks in the neighborhood.  She walks up stairs and up hills some times.  She gets winded but does not have to stop.  Father died at age 11 from CHF. No early CAD noted.  Brotehr had a stroke in his 52s.    She has had borderline diabetes controlled with diet. She was put on metformin, but then with weight loss of 26 lbs, she was able to come off of the medicine.     Past Medical History  Diagnosis Date  . Hypertension   . Hyperlipidemia   . Thyroid disease   . Cough   . Edema   . GERD (gastroesophageal reflux disease)     Barrett's Esophagus  . Low bone mass 2008  . History of colon polyps 2009, 2012  . Vitamin D insufficiency     04/2009  . Diabetes mellitus without complication   . BCC (basal cell carcinoma), face     nasolabial fold, s/p MOHS  . Nephrolithiasis     staghorn  . Cholelithiasis   . Diverticulosis     Past Surgical History  Procedure Laterality Date  . Vaginal hysterectomy    . Parathyroidectomy    . Cataract extraction, bilateral    . Mohs surgery      nasolabial fold  . Cholecystectomy  2012     Current Outpatient Prescriptions  Medication Sig Dispense Refill  . albuterol (PROVENTIL HFA;VENTOLIN HFA) 108 (90 BASE) MCG/ACT inhaler Inhale 2 puffs into the lungs every 4 (four) hours  as needed for wheezing or shortness of breath. 3 Inhaler 1  . atorvastatin (LIPITOR) 40 MG tablet TAKE 1 TABLET DAILY (NEED CHECK UP FOR ADDITIONAL REFILLS) 30 tablet 0  . ciprofloxacin (CIPRO) 500 MG tablet Take beginning 1 week prior to surgery    . hydrochlorothiazide (HYDRODIURIL) 25 MG tablet Take 1 tablet (25 mg total) by mouth daily. PATIENT NEEDS CHECK UP FOR ADDITIONAL REFILLS 30 tablet 0  . levothyroxine (SYNTHROID, LEVOTHROID) 88 MCG tablet TAKE 1 TABLET BY MOUTH DAILY. 90 tablet 0  . pantoprazole (PROTONIX) 40 MG tablet TAKE 1 TABLET BY MOUTH EVERY DAY 90 tablet 3   No current facility-administered medications for this visit.    Allergies:   Actonel    Social History:  The patient  reports that she has been smoking Cigarettes.  She has a 40 pack-year smoking history. She has never used smokeless tobacco. She reports that she does not drink alcohol or use illicit drugs.   Family History:  The patient's family history includes Diabetes in her mother; Heart disease in her father; Stroke in her brother. No early CAD that she knows of.   ROS:  Please see  the history of present illness.   Otherwise, review of systems are positive for blood in her urine.   All other systems are reviewed and negative.    PHYSICAL EXAM: VS:  BP 128/76 mmHg  Pulse 76  Ht 5\' 2"  (1.575 m)  Wt 146 lb (66.225 kg)  BMI 26.70 kg/m2 , BMI Body mass index is 26.7 kg/(m^2). GEN: Well nourished, well developed, in no acute distress HEENT: normal Neck: no JVD, carotid bruits, or masses Cardiac: RRR; no murmurs, rubs, or gallops,no edema  Respiratory:  clear to auscultation bilaterally, normal work of breathing GI: soft, nontender, nondistended, + BS MS: no deformity or atrophy Skin: warm and dry, no rash Neuro:  Strength and sensation are intact Psych: euthymic mood, full affect   EKG:   The ekg ordered today demonstrates NSR, no ST segment changes   Recent Labs: 07/08/2014: ALT 17; BUN 18;  Creatinine 1.03; Hemoglobin 15.2*; Platelets 268; Potassium 5.0; Sodium 142; TSH 1.385   Lipid Panel    Component Value Date/Time   CHOL 140 09/12/2013 1717   TRIG 114 09/12/2013 1717   HDL 40 09/12/2013 1717   CHOLHDL 3.5 09/12/2013 1717   VLDL 23 09/12/2013 1717   LDLCALC 77 09/12/2013 1717     Other studies Reviewed: Additional studies/ records that were reviewed today with results demonstrating: .   ASSESSMENT AND PLAN:  1. Preoperative evaluation: We discussed exercise treadmill testing to risk stratify her for surgery. I think she is a low to moderate risk patient having a low to moderate risk surgery. It is unclear whether she consistently gets up to 4 METS of activity. She would like to hold off on exercise treadmill test. She will try to walk more around her neighborhood. She does have feels that she can easily walk. If she can walk 30 minutes daily 5 days a week without any chest discomfort or shortness of breath, no further cardiac testing is required before her kidney stone removal surgery.  The patient was counseled on the dangers of tobacco use, both inhaled and oral, which include, but are not limited to cardiovascular disease The patient was firmly advised to quit.    Hypertension: Continue current medicines. She is well controlled.      Current medicines are reviewed at length with the patient today.  The patient concerns regarding her medicines were addressed.  The following changes have been made:  No change  Labs/ tests ordered today include:  No orders of the defined types were placed in this encounter.    Recommend 150 minutes/week of aerobic exercise Low fat, low carb, high fiber diet recommended  Disposition:   FU prn   Teresita Madura., MD  07/16/2014 10:14 AM    [address], [place], [place]  [address] Phone: [phone]; Fax: [phone]

## 2014-07-28 ENCOUNTER — Telehealth: Payer: Self-pay | Admitting: Interventional Cardiology

## 2014-07-28 DIAGNOSIS — H524 Presbyopia: Secondary | ICD-10-CM | POA: Diagnosis not present

## 2014-07-28 DIAGNOSIS — H5210 Myopia, unspecified eye: Secondary | ICD-10-CM | POA: Diagnosis not present

## 2014-07-28 NOTE — Telephone Encounter (Signed)
New message        Request for surgical clearance:  What type of surgery is being performed? Large kidney stone When is this surgery scheduled? 08-15-14 1. Are there any medications that need to be held prior to surgery and how long? Not sure---pt was recently seen for clearance  2. Name of physician performing surgery? Dr Louis Meckel  3. What is your office phone and fax number? Fax [phone]

## 2014-07-28 NOTE — Telephone Encounter (Signed)
lmtcb

## 2014-07-28 NOTE — Telephone Encounter (Signed)
Has the patient been walking more and has she been having any SHOB or CP?

## 2014-07-29 NOTE — Telephone Encounter (Signed)
Spoke with pt and she states that she has been walking more. Pt denies any CP or SHOB. Pt states that she feels great. Will forward to Dr. Irish Lack for review.

## 2014-07-29 NOTE — Telephone Encounter (Signed)
No further cardiac testing needed before kidney stone surgery.

## 2014-07-29 NOTE — Telephone Encounter (Signed)
Will forward information to [facility] at [facility].

## 2014-07-30 ENCOUNTER — Telehealth: Payer: Self-pay | Admitting: Interventional Cardiology

## 2014-07-30 NOTE — Telephone Encounter (Signed)
Spoke with Pam from [facility] and informed her that Dr. Irish Lack said there was no further cardiac testing needed before kidney stone surgery. Verified fax number and informed Pam that I would resend this information to her. Pam verbalized understanding and was in agreement with this plan.

## 2014-07-30 NOTE — Telephone Encounter (Signed)
New Message        Calling to check on pt's surgical clearance that was faxed to our office. Please call back and advise.

## 2014-07-31 ENCOUNTER — Other Ambulatory Visit: Payer: Self-pay | Admitting: Urology

## 2014-08-06 NOTE — Patient Instructions (Addendum)
Erika Mason  08/06/2014   Your procedure is scheduled on: 08-15-2014  Report to [facility] Main  Entrance and follow signs to               [place] [facility] [place].   Call this number if you have problems the morning of surgery [phone]   Remember:  Do not eat food or drink liquids :After Midnight.     Take these medicines the morning of surgery with A SIP OF WATER: Albuterol Inhaler if needed and bring , Synthroid, Protonix                               You may not have any metal on your body including hair pins and              piercings  Do not wear jewelry, make-up, lotions, powders or perfumes, deodorant             Do not wear nail polish.  Do not shave  48 hours prior to surgery.              Men may shave face and neck.   Do not bring valuables to the hospital. [place].  Contacts, dentures or bridgework may not be worn into surgery.  Leave suitcase in the car. After surgery it may be brought to your room.       Special Instructions : coughing and deep breathing exercises, leg exercises.           _____________________________________________________________________             [facility] - Preparing for Surgery Before surgery, you can play an important role.  Because skin is not sterile, your skin needs to be as free of germs as possible.  You can reduce the number of germs on your skin by washing with CHG (chlorahexidine gluconate) soap before surgery.  CHG is an antiseptic cleaner which kills germs and bonds with the skin to continue killing germs even after washing. Please DO NOT use if you have an allergy to CHG or antibacterial soaps.  If your skin becomes reddened/irritated stop using the CHG and inform your nurse when you arrive at Short Stay. Do not shave (including legs and underarms) for at least 48 hours prior to the first CHG shower.  You may shave your face/neck. Please  follow these instructions carefully:  1.  Shower with CHG Soap the night before surgery and the  morning of Surgery.  2.  If you choose to wash your hair, wash your hair first as usual with your  normal  shampoo.  3.  After you shampoo, rinse your hair and body thoroughly to remove the  shampoo.                           4.  Use CHG as you would any other liquid soap.  You can apply chg directly  to the skin and wash                       Gently with a scrungie or clean washcloth.  5.  Apply the CHG Soap to your body ONLY  FROM THE NECK DOWN.   Do not use on face/ open                           Wound or open sores. Avoid contact with eyes, ears mouth and genitals (private parts).                       Wash face,  Genitals (private parts) with your normal soap.             6.  Wash thoroughly, paying special attention to the area where your surgery  will be performed.  7.  Thoroughly rinse your body with warm water from the neck down.  8.  DO NOT shower/wash with your normal soap after using and rinsing off  the CHG Soap.                9.  Pat yourself dry with a clean towel.            10.  Wear clean pajamas.            11.  Place clean sheets on your bed the night of your first shower and do not  sleep with pets. Day of Surgery : Do not apply any lotions/deodorants the morning of surgery.  Please wear clean clothes to the hospital/surgery center.  FAILURE TO FOLLOW THESE INSTRUCTIONS MAY RESULT IN THE CANCELLATION OF YOUR SURGERY PATIENT SIGNATURE_________________________________  NURSE SIGNATURE__________________________________  ________________________________________________________________________  WHAT IS A BLOOD TRANSFUSION? Blood Transfusion Information  A transfusion is the replacement of blood or some of its parts. Blood is made up of multiple cells which provide different functions.  Red blood cells carry oxygen and are used for blood loss replacement.  White blood cells  fight against infection.  Platelets control bleeding.  Plasma helps clot blood.  Other blood products are available for specialized needs, such as hemophilia or other clotting disorders. BEFORE THE TRANSFUSION  Who gives blood for transfusions?   Healthy volunteers who are fully evaluated to make sure their blood is safe. This is blood bank blood. Transfusion therapy is the safest it has ever been in the practice of medicine. Before blood is taken from a donor, a complete history is taken to make sure that person has no history of diseases nor engages in risky social behavior (examples are intravenous drug use or sexual activity with multiple partners). The donor's travel history is screened to minimize risk of transmitting infections, such as malaria. The donated blood is tested for signs of infectious diseases, such as HIV and hepatitis. The blood is then tested to be sure it is compatible with you in order to minimize the chance of a transfusion reaction. If you or a relative donates blood, this is often done in anticipation of surgery and is not appropriate for emergency situations. It takes many days to process the donated blood. RISKS AND COMPLICATIONS Although transfusion therapy is very safe and saves many lives, the main dangers of transfusion include:   Getting an infectious disease.  Developing a transfusion reaction. This is an allergic reaction to something in the blood you were given. Every precaution is taken to prevent this. The decision to have a blood transfusion has been considered carefully by your caregiver before blood is given. Blood is not given unless the benefits outweigh the risks. AFTER THE TRANSFUSION  Right after receiving a blood transfusion, you will usually feel much better  and more energetic. This is especially true if your red blood cells have gotten low (anemic). The transfusion raises the level of the red blood cells which carry oxygen, and this usually  causes an energy increase.  The nurse administering the transfusion will monitor you carefully for complications. HOME CARE INSTRUCTIONS  No special instructions are needed after a transfusion. You may find your energy is better. Speak with your caregiver about any limitations on activity for underlying diseases you may have. SEEK MEDICAL CARE IF:   Your condition is not improving after your transfusion.  You develop redness or irritation at the intravenous (IV) site. SEEK IMMEDIATE MEDICAL CARE IF:  Any of the following symptoms occur over the next 12 hours:  Shaking chills.  You have a temperature by mouth above 102 F (38.9 C), not controlled by medicine.  Chest, back, or muscle pain.  People around you feel you are not acting correctly or are confused.  Shortness of breath or difficulty breathing.  Dizziness and fainting.  You get a rash or develop hives.  You have a decrease in urine output.  Your urine turns a dark color or changes to pink, red, or brown. Any of the following symptoms occur over the next 10 days:  You have a temperature by mouth above 102 F (38.9 C), not controlled by medicine.  Shortness of breath.  Weakness after normal activity.  The white part of the eye turns yellow (jaundice).  You have a decrease in the amount of urine or are urinating less often.  Your urine turns a dark color or changes to pink, red, or brown. Document Released: 03/25/2000 Document Revised: 06/20/2011 Document Reviewed: 11/12/2007 [facility] Patient Information 2014 [place], [place].  _______________________________________________________________________

## 2014-08-07 ENCOUNTER — Encounter (HOSPITAL_COMMUNITY)
Admission: RE | Admit: 2014-08-07 | Discharge: 2014-08-07 | Disposition: A | Discharge status: Home / Self Care | Payer: Commercial Managed Care - HMO | Source: Ambulatory Visit | Attending: Urology | Admitting: Urology

## 2014-08-07 ENCOUNTER — Encounter (HOSPITAL_COMMUNITY): Payer: Self-pay

## 2014-08-07 DIAGNOSIS — N200 Calculus of kidney: Secondary | ICD-10-CM | POA: Insufficient documentation

## 2014-08-07 DIAGNOSIS — Z01812 Encounter for preprocedural laboratory examination: Secondary | ICD-10-CM | POA: Insufficient documentation

## 2014-08-07 HISTORY — DX: Other specified postprocedural states: R11.2

## 2014-08-07 HISTORY — DX: Hypothyroidism, unspecified: E03.9

## 2014-08-07 HISTORY — DX: Other specified postprocedural states: Z98.890

## 2014-08-07 LAB — CBC
HCT: 46.3 % — ABNORMAL HIGH (ref 36.0–46.0)
HEMOGLOBIN: 15 g/dL (ref 12.0–15.0)
MCH: 30 pg (ref 26.0–34.0)
MCHC: 32.4 g/dL (ref 30.0–36.0)
MCV: 92.6 fL (ref 78.0–100.0)
Platelets: 252 10*3/uL (ref 150–400)
RBC: 5 MIL/uL (ref 3.87–5.11)
RDW: 13.8 % (ref 11.5–15.5)
WBC: 11.7 10*3/uL — ABNORMAL HIGH (ref 4.0–10.5)

## 2014-08-07 LAB — BASIC METABOLIC PANEL
Anion gap: 10 (ref 5–15)
BUN: 14 mg/dL (ref 6–23)
CALCIUM: 9.7 mg/dL (ref 8.4–10.5)
CO2: 29 mmol/L (ref 19–32)
Chloride: 102 mmol/L (ref 96–112)
Creatinine, Ser: 1.01 mg/dL (ref 0.50–1.10)
GFR, EST AFRICAN AMERICAN: 64 mL/min — AB (ref >90)
GFR, EST NON AFRICAN AMERICAN: 55 mL/min — AB (ref >90)
GLUCOSE: 91 mg/dL (ref 70–99)
Potassium: 4.2 mmol/L (ref 3.5–5.1)
SODIUM: 141 mmol/L (ref 135–145)

## 2014-08-07 LAB — URINE CULTURE
CULTURE: NO GROWTH
Colony Count: NO GROWTH

## 2014-08-07 NOTE — Progress Notes (Signed)
07/16/14 EKG in EPIC  07/16/2014- LOV- DR Irish Lack in [facility]  See Telephone note in [facility] -07/28/14 for clearance

## 2014-08-07 NOTE — Progress Notes (Signed)
CBC results done 08/07/14 faxed via EPIC to Dr Louis Meckel.

## 2014-08-15 ENCOUNTER — Encounter (HOSPITAL_COMMUNITY): Payer: Self-pay | Admitting: *Deleted

## 2014-08-15 ENCOUNTER — Ambulatory Visit (HOSPITAL_COMMUNITY): Payer: Commercial Managed Care - HMO | Admitting: Anesthesiology

## 2014-08-15 ENCOUNTER — Observation Stay (HOSPITAL_COMMUNITY)
Admission: RE | Admit: 2014-08-15 | Discharge: 2014-08-17 | Disposition: A | Discharge status: Home / Self Care | Payer: Commercial Managed Care - HMO | Source: Ambulatory Visit | Attending: Urology | Admitting: Urology

## 2014-08-15 ENCOUNTER — Encounter (HOSPITAL_COMMUNITY)
Admission: RE | Disposition: A | Discharge status: Home / Self Care | Payer: Self-pay | Source: Ambulatory Visit | Attending: Urology

## 2014-08-15 ENCOUNTER — Ambulatory Visit (HOSPITAL_COMMUNITY): Payer: Commercial Managed Care - HMO

## 2014-08-15 ENCOUNTER — Observation Stay (HOSPITAL_COMMUNITY): Payer: Commercial Managed Care - HMO

## 2014-08-15 DIAGNOSIS — K219 Gastro-esophageal reflux disease without esophagitis: Secondary | ICD-10-CM | POA: Diagnosis not present

## 2014-08-15 DIAGNOSIS — I10 Essential (primary) hypertension: Secondary | ICD-10-CM | POA: Insufficient documentation

## 2014-08-15 DIAGNOSIS — Z86010 Personal history of colonic polyps: Secondary | ICD-10-CM | POA: Insufficient documentation

## 2014-08-15 DIAGNOSIS — K5790 Diverticulosis of intestine, part unspecified, without perforation or abscess without bleeding: Secondary | ICD-10-CM | POA: Insufficient documentation

## 2014-08-15 DIAGNOSIS — E559 Vitamin D deficiency, unspecified: Secondary | ICD-10-CM | POA: Insufficient documentation

## 2014-08-15 DIAGNOSIS — E039 Hypothyroidism, unspecified: Secondary | ICD-10-CM | POA: Diagnosis not present

## 2014-08-15 DIAGNOSIS — E119 Type 2 diabetes mellitus without complications: Secondary | ICD-10-CM | POA: Diagnosis not present

## 2014-08-15 DIAGNOSIS — N200 Calculus of kidney: Principal | ICD-10-CM | POA: Insufficient documentation

## 2014-08-15 DIAGNOSIS — E785 Hyperlipidemia, unspecified: Secondary | ICD-10-CM | POA: Diagnosis not present

## 2014-08-15 DIAGNOSIS — Z79899 Other long term (current) drug therapy: Secondary | ICD-10-CM | POA: Diagnosis not present

## 2014-08-15 DIAGNOSIS — Z888 Allergy status to other drugs, medicaments and biological substances status: Secondary | ICD-10-CM | POA: Insufficient documentation

## 2014-08-15 DIAGNOSIS — R05 Cough: Secondary | ICD-10-CM | POA: Diagnosis not present

## 2014-08-15 HISTORY — PX: HOLMIUM LASER APPLICATION: SHX5852

## 2014-08-15 HISTORY — PX: NEPHROLITHOTOMY: SHX5134

## 2014-08-15 LAB — BASIC METABOLIC PANEL
Anion gap: 14 (ref 5–15)
BUN: 18 mg/dL (ref 6–20)
CALCIUM: 8.1 mg/dL — AB (ref 8.9–10.3)
CO2: 23 mmol/L (ref 22–32)
CREATININE: 1.67 mg/dL — AB (ref 0.44–1.00)
Chloride: 103 mmol/L (ref 101–111)
GFR calc Af Amer: 35 mL/min — ABNORMAL LOW (ref >60)
GFR, EST NON AFRICAN AMERICAN: 30 mL/min — AB (ref >60)
GLUCOSE: 179 mg/dL — AB (ref 70–99)
Potassium: 3.2 mmol/L — ABNORMAL LOW (ref 3.5–5.1)
SODIUM: 140 mmol/L (ref 135–145)

## 2014-08-15 LAB — GLUCOSE, CAPILLARY
Glucose-Capillary: 135 mg/dL — ABNORMAL HIGH (ref 70–99)
Glucose-Capillary: 159 mg/dL — ABNORMAL HIGH (ref 70–99)

## 2014-08-15 LAB — HEMOGLOBIN AND HEMATOCRIT, BLOOD
HEMATOCRIT: 32.7 % — AB (ref 36.0–46.0)
Hemoglobin: 10.6 g/dL — ABNORMAL LOW (ref 12.0–15.0)

## 2014-08-15 LAB — TYPE AND SCREEN
ABO/RH(D): A NEG
Antibody Screen: NEGATIVE

## 2014-08-15 LAB — ABO/RH: ABO/RH(D): A NEG

## 2014-08-15 SURGERY — NEPHROLITHOTOMY PERCUTANEOUS
Anesthesia: General

## 2014-08-15 MED ORDER — ONDANSETRON HCL 4 MG/2ML IJ SOLN
INTRAMUSCULAR | Status: DC | PRN
  Administered 2014-08-15: 4 mg via INTRAVENOUS

## 2014-08-15 MED ORDER — MORPHINE SULFATE 2 MG/ML IJ SOLN
2.0000 mg | INTRAMUSCULAR | Status: DC | PRN
  Administered 2014-08-15 – 2014-08-16 (×2): 2 mg via INTRAVENOUS
  Filled 2014-08-15 (×2): qty 1

## 2014-08-15 MED ORDER — ONDANSETRON HCL 4 MG/2ML IJ SOLN
4.0000 mg | INTRAMUSCULAR | Status: DC | PRN
  Administered 2014-08-15 (×2): 4 mg via INTRAVENOUS
  Filled 2014-08-15 (×2): qty 2

## 2014-08-15 MED ORDER — PROPOFOL 10 MG/ML IV BOLUS
INTRAVENOUS | Status: DC | PRN
  Administered 2014-08-15: 150 mg via INTRAVENOUS

## 2014-08-15 MED ORDER — BUPIVACAINE-EPINEPHRINE 0.5% -1:200000 IJ SOLN
INTRAMUSCULAR | Status: DC | PRN
  Administered 2014-08-15: 30 mL

## 2014-08-15 MED ORDER — STERILE WATER FOR IRRIGATION IR SOLN
Status: DC | PRN
  Administered 2014-08-15: 500 mL

## 2014-08-15 MED ORDER — CEFAZOLIN SODIUM-DEXTROSE 2-3 GM-% IV SOLR
2.0000 g | INTRAVENOUS | Status: AC
  Administered 2014-08-15: 2 g via INTRAVENOUS

## 2014-08-15 MED ORDER — CIPROFLOXACIN IN D5W 400 MG/200ML IV SOLN
400.0000 mg | INTRAVENOUS | Status: AC
  Administered 2014-08-15: 400 mg via INTRAVENOUS

## 2014-08-15 MED ORDER — LEVOTHYROXINE SODIUM 88 MCG PO TABS
88.0000 ug | ORAL_TABLET | Freq: Every day | ORAL | Status: DC
  Administered 2014-08-16: 88 ug via ORAL
  Filled 2014-08-15: qty 1

## 2014-08-15 MED ORDER — DEXAMETHASONE SODIUM PHOSPHATE 10 MG/ML IJ SOLN
INTRAMUSCULAR | Status: DC | PRN
  Administered 2014-08-15: 10 mg via INTRAVENOUS

## 2014-08-15 MED ORDER — BUPIVACAINE-EPINEPHRINE (PF) 0.5% -1:200000 IJ SOLN
INTRAMUSCULAR | Status: AC
  Filled 2014-08-15: qty 30

## 2014-08-15 MED ORDER — SODIUM CHLORIDE 0.9 % IV SOLN
INTRAVENOUS | Status: AC
  Administered 2014-08-15 (×2): via INTRAVENOUS

## 2014-08-15 MED ORDER — ROCURONIUM BROMIDE 100 MG/10ML IV SOLN
INTRAVENOUS | Status: AC
  Filled 2014-08-15: qty 1

## 2014-08-15 MED ORDER — CEFAZOLIN SODIUM-DEXTROSE 2-3 GM-% IV SOLR
INTRAVENOUS | Status: AC
  Filled 2014-08-15: qty 50

## 2014-08-15 MED ORDER — OXYCODONE HCL 5 MG PO TABS
5.0000 mg | ORAL_TABLET | ORAL | Status: DC | PRN
  Administered 2014-08-15 – 2014-08-16 (×2): 5 mg via ORAL
  Filled 2014-08-15 (×2): qty 1

## 2014-08-15 MED ORDER — ACETAMINOPHEN 500 MG PO TABS
1000.0000 mg | ORAL_TABLET | Freq: Four times a day (QID) | ORAL | Status: AC
  Administered 2014-08-15 – 2014-08-16 (×4): 1000 mg via ORAL
  Filled 2014-08-15 (×4): qty 2

## 2014-08-15 MED ORDER — GLYCOPYRROLATE 0.2 MG/ML IJ SOLN
INTRAMUSCULAR | Status: AC
  Filled 2014-08-15: qty 3

## 2014-08-15 MED ORDER — LACTATED RINGERS IV SOLN
INTRAVENOUS | Status: DC
  Administered 2014-08-15: 14:00:00 via INTRAVENOUS

## 2014-08-15 MED ORDER — ATORVASTATIN CALCIUM 40 MG PO TABS
40.0000 mg | ORAL_TABLET | Freq: Every day | ORAL | Status: DC
  Administered 2014-08-16: 40 mg via ORAL
  Filled 2014-08-15 (×2): qty 1

## 2014-08-15 MED ORDER — NEOSTIGMINE METHYLSULFATE 10 MG/10ML IV SOLN
INTRAVENOUS | Status: DC | PRN
  Administered 2014-08-15: 4 mg via INTRAVENOUS

## 2014-08-15 MED ORDER — MIDAZOLAM HCL 2 MG/2ML IJ SOLN
INTRAMUSCULAR | Status: AC
  Filled 2014-08-15: qty 2

## 2014-08-15 MED ORDER — FENTANYL CITRATE (PF) 100 MCG/2ML IJ SOLN
INTRAMUSCULAR | Status: AC
  Filled 2014-08-15: qty 2

## 2014-08-15 MED ORDER — PHENYLEPHRINE HCL 10 MG/ML IJ SOLN
INTRAMUSCULAR | Status: AC
  Filled 2014-08-15: qty 1

## 2014-08-15 MED ORDER — ALBUTEROL SULFATE (2.5 MG/3ML) 0.083% IN NEBU
3.0000 mL | INHALATION_SOLUTION | RESPIRATORY_TRACT | Status: DC | PRN
  Administered 2014-08-16: 3 mL via RESPIRATORY_TRACT
  Filled 2014-08-15: qty 3

## 2014-08-15 MED ORDER — SODIUM CHLORIDE 0.9 % IR SOLN
Status: DC | PRN
  Administered 2014-08-15: 59000 mL

## 2014-08-15 MED ORDER — SENNOSIDES-DOCUSATE SODIUM 8.6-50 MG PO TABS
2.0000 | ORAL_TABLET | Freq: Every day | ORAL | Status: DC
  Administered 2014-08-15 – 2014-08-16 (×2): 2 via ORAL
  Filled 2014-08-15 (×2): qty 2

## 2014-08-15 MED ORDER — EPHEDRINE SULFATE 50 MG/ML IJ SOLN
INTRAMUSCULAR | Status: AC
  Filled 2014-08-15: qty 1

## 2014-08-15 MED ORDER — PHENYLEPHRINE 40 MCG/ML (10ML) SYRINGE FOR IV PUSH (FOR BLOOD PRESSURE SUPPORT)
PREFILLED_SYRINGE | INTRAVENOUS | Status: AC
  Filled 2014-08-15: qty 10

## 2014-08-15 MED ORDER — OXYBUTYNIN CHLORIDE 5 MG PO TABS: 5.0000 mg | ORAL_TABLET | Freq: Three times a day (TID) | ORAL | Status: DC | PRN

## 2014-08-15 MED ORDER — MIDAZOLAM HCL 5 MG/5ML IJ SOLN
INTRAMUSCULAR | Status: DC | PRN
  Administered 2014-08-15 (×2): 1 mg via INTRAVENOUS

## 2014-08-15 MED ORDER — DIPHENHYDRAMINE HCL 12.5 MG/5ML PO ELIX
12.5000 mg | ORAL_SOLUTION | Freq: Four times a day (QID) | ORAL | Status: DC | PRN
Start: 2014-08-15 — End: 2014-08-17

## 2014-08-15 MED ORDER — SODIUM CHLORIDE 0.9 % IV SOLN
10.0000 mg | INTRAVENOUS | Status: DC | PRN
  Administered 2014-08-15: 10 ug/min via INTRAVENOUS

## 2014-08-15 MED ORDER — ROCURONIUM BROMIDE 100 MG/10ML IV SOLN
INTRAVENOUS | Status: DC | PRN
  Administered 2014-08-15 (×2): 10 mg via INTRAVENOUS
  Administered 2014-08-15: 50 mg via INTRAVENOUS
  Administered 2014-08-15 (×2): 10 mg via INTRAVENOUS

## 2014-08-15 MED ORDER — 0.9 % SODIUM CHLORIDE (POUR BTL) OPTIME
TOPICAL | Status: DC | PRN
Start: 2014-08-15 — End: 2014-08-15
  Administered 2014-08-15: 1000 mL

## 2014-08-15 MED ORDER — PANTOPRAZOLE SODIUM 40 MG PO TBEC
40.0000 mg | DELAYED_RELEASE_TABLET | Freq: Every day | ORAL | Status: DC
  Administered 2014-08-16: 40 mg via ORAL
  Filled 2014-08-15 (×2): qty 1

## 2014-08-15 MED ORDER — GLYCOPYRROLATE 0.2 MG/ML IJ SOLN
INTRAMUSCULAR | Status: DC | PRN
  Administered 2014-08-15: .6 mg via INTRAVENOUS

## 2014-08-15 MED ORDER — MORPHINE SULFATE 10 MG/ML IJ SOLN
2.0000 mg | INTRAMUSCULAR | Status: DC | PRN
  Administered 2014-08-15: 2 mg via INTRAVENOUS
  Filled 2014-08-15: qty 1

## 2014-08-15 MED ORDER — ONDANSETRON HCL 4 MG/2ML IJ SOLN
INTRAMUSCULAR | Status: AC
  Filled 2014-08-15: qty 2

## 2014-08-15 MED ORDER — FENTANYL CITRATE (PF) 100 MCG/2ML IJ SOLN
INTRAMUSCULAR | Status: DC | PRN
  Administered 2014-08-15 (×4): 50 ug via INTRAVENOUS

## 2014-08-15 MED ORDER — LIDOCAINE HCL (CARDIAC) 20 MG/ML IV SOLN
INTRAVENOUS | Status: AC
  Filled 2014-08-15: qty 5

## 2014-08-15 MED ORDER — FENTANYL CITRATE (PF) 250 MCG/5ML IJ SOLN
INTRAMUSCULAR | Status: AC
  Filled 2014-08-15: qty 5

## 2014-08-15 MED ORDER — CEFAZOLIN SODIUM 1-5 GM-% IV SOLN
1.0000 g | Freq: Three times a day (TID) | INTRAVENOUS | Status: DC
  Administered 2014-08-15 – 2014-08-16 (×5): 1 g via INTRAVENOUS
  Filled 2014-08-15 (×7): qty 50

## 2014-08-15 MED ORDER — LACTATED RINGERS IV SOLN
INTRAVENOUS | Status: DC | PRN
  Administered 2014-08-15 (×3): via INTRAVENOUS

## 2014-08-15 MED ORDER — METOCLOPRAMIDE HCL 5 MG/ML IJ SOLN
INTRAMUSCULAR | Status: DC | PRN
  Administered 2014-08-15: 10 mg via INTRAVENOUS

## 2014-08-15 MED ORDER — EPHEDRINE SULFATE 50 MG/ML IJ SOLN
INTRAMUSCULAR | Status: DC | PRN
  Administered 2014-08-15: 5 mg via INTRAVENOUS
  Administered 2014-08-15: 20 mg via INTRAVENOUS

## 2014-08-15 MED ORDER — SALINE SPRAY 0.65 % NA SOLN
1.0000 | NASAL | Status: DC | PRN
  Filled 2014-08-15: qty 44

## 2014-08-15 MED ORDER — DIPHENHYDRAMINE HCL 50 MG/ML IJ SOLN: 12.5000 mg | Freq: Four times a day (QID) | INTRAMUSCULAR | Status: DC | PRN

## 2014-08-15 MED ORDER — PROPOFOL 10 MG/ML IV BOLUS
INTRAVENOUS | Status: AC
Start: 2014-08-15 — End: 2014-08-15
  Filled 2014-08-15: qty 20

## 2014-08-15 MED ORDER — FENTANYL CITRATE (PF) 100 MCG/2ML IJ SOLN
25.0000 ug | INTRAMUSCULAR | Status: DC | PRN
  Administered 2014-08-15 (×2): 25 ug via INTRAVENOUS

## 2014-08-15 MED ORDER — PHENYLEPHRINE HCL 10 MG/ML IJ SOLN
INTRAMUSCULAR | Status: DC | PRN
  Administered 2014-08-15 (×2): 40 ug via INTRAVENOUS
  Administered 2014-08-15: 80 ug via INTRAVENOUS
  Administered 2014-08-15: 40 ug via INTRAVENOUS
  Administered 2014-08-15 (×2): 80 ug via INTRAVENOUS
  Administered 2014-08-15: 40 ug via INTRAVENOUS

## 2014-08-15 MED ORDER — SODIUM CHLORIDE 0.9 % IJ SOLN
INTRAMUSCULAR | Status: AC
  Filled 2014-08-15: qty 10

## 2014-08-15 MED ORDER — CIPROFLOXACIN IN D5W 400 MG/200ML IV SOLN
400.0000 mg | Freq: Two times a day (BID) | INTRAVENOUS | Status: DC
  Administered 2014-08-15 – 2014-08-17 (×4): 400 mg via INTRAVENOUS
  Filled 2014-08-15 (×4): qty 200

## 2014-08-15 MED ORDER — CIPROFLOXACIN IN D5W 400 MG/200ML IV SOLN
INTRAVENOUS | Status: AC
  Filled 2014-08-15: qty 200

## 2014-08-15 MED ORDER — LIDOCAINE HCL (CARDIAC) 20 MG/ML IV SOLN
INTRAVENOUS | Status: DC | PRN
  Administered 2014-08-15: 60 mg via INTRAVENOUS

## 2014-08-15 MED ORDER — NEOSTIGMINE METHYLSULFATE 10 MG/10ML IV SOLN
INTRAVENOUS | Status: AC
  Filled 2014-08-15: qty 1

## 2014-08-15 SURGICAL SUPPLY — 61 items
BAG URINE DRAINAGE (UROLOGICAL SUPPLIES) ×4 IMPLANT
BASKET LASER NITINOL 1.9FR (BASKET) ×4 IMPLANT
BASKET ZERO TIP NITINOL 2.4FR (BASKET) IMPLANT
BENZOIN TINCTURE PRP APPL 2/3 (GAUZE/BANDAGES/DRESSINGS) ×8 IMPLANT
BLADE SURG 15 STRL LF DISP TIS (BLADE) ×2 IMPLANT
BLADE SURG 15 STRL SS (BLADE) ×2
CATH AINSWORTH 30CC 24FR (CATHETERS) ×8 IMPLANT
CATH BEACON 5.038 65CM KMP-01 (CATHETERS) ×4 IMPLANT
CATH FOLEY 2WAY SLVR  5CC 16FR (CATHETERS) ×2
CATH FOLEY 2WAY SLVR 5CC 16FR (CATHETERS) ×2 IMPLANT
CATH URET 5FR 28IN OPEN ENDED (CATHETERS) ×4 IMPLANT
CATH URET DUAL LUMEN 6-10FR 50 (CATHETERS) ×4 IMPLANT
CATH X-FORCE N30 NEPHROSTOMY (TUBING) ×8 IMPLANT
CHLORAPREP W/TINT 26ML (MISCELLANEOUS) ×4 IMPLANT
COVER SURGICAL LIGHT HANDLE (MISCELLANEOUS) ×4 IMPLANT
DRAPE C-ARM 42X120 X-RAY (DRAPES) ×4 IMPLANT
DRAPE LINGEMAN PERC (DRAPES) ×4 IMPLANT
DRAPE SHEET LG 3/4 BI-LAMINATE (DRAPES) ×4 IMPLANT
DRAPE SURG IRRIG POUCH 19X23 (DRAPES) ×4 IMPLANT
DRSG PAD ABDOMINAL 8X10 ST (GAUZE/BANDAGES/DRESSINGS) ×4 IMPLANT
DRSG TEGADERM 4X4.75 (GAUZE/BANDAGES/DRESSINGS) ×4 IMPLANT
DRSG TEGADERM 8X12 (GAUZE/BANDAGES/DRESSINGS) ×4 IMPLANT
FIBER LASER FLEXIVA 1000 (UROLOGICAL SUPPLIES) IMPLANT
FIBER LASER FLEXIVA 200 (UROLOGICAL SUPPLIES) ×4 IMPLANT
FIBER LASER FLEXIVA 365 (UROLOGICAL SUPPLIES) IMPLANT
FIBER LASER FLEXIVA 550 (UROLOGICAL SUPPLIES) IMPLANT
FIBER LASER TRAC TIP (UROLOGICAL SUPPLIES) ×8 IMPLANT
GAUZE SPONGE 4X4 12PLY STRL (GAUZE/BANDAGES/DRESSINGS) ×4 IMPLANT
GLOVE BIOGEL M STRL SZ7.5 (GLOVE) ×8 IMPLANT
GLOVE BIOGEL PI IND STRL 7.5 (GLOVE) ×2 IMPLANT
GLOVE BIOGEL PI INDICATOR 7.5 (GLOVE) ×2
GLOVE ECLIPSE 7.5 STRL STRAW (GLOVE) ×4 IMPLANT
GOWN STRL REUS W/TWL LRG LVL3 (GOWN DISPOSABLE) ×8 IMPLANT
GOWN STRL REUS W/TWL XL LVL3 (GOWN DISPOSABLE) ×12 IMPLANT
GUIDEWIRE AMPLAZ .035X145 (WIRE) ×16 IMPLANT
GUIDEWIRE ANG ZIPWIRE 038X150 (WIRE) IMPLANT
GUIDEWIRE STR DUAL SENSOR (WIRE) ×8 IMPLANT
IV NS IRRIG 3000ML ARTHROMATIC (IV SOLUTION) IMPLANT
IV SET MACRO CATH EXT 6 LUER (IV SETS) ×4 IMPLANT
KIT BASIN OR (CUSTOM PROCEDURE TRAY) ×4 IMPLANT
MANIFOLD NEPTUNE II (INSTRUMENTS) ×4 IMPLANT
MARKER SKIN DUAL TIP RULER LAB (MISCELLANEOUS) ×4 IMPLANT
NEEDLE TROCAR 18X15 ECHO (NEEDLE) IMPLANT
NEEDLE TROCAR 18X20 (NEEDLE) ×8 IMPLANT
NS IRRIG 1000ML POUR BTL (IV SOLUTION) IMPLANT
PACK BASIC VI WITH GOWN DISP (CUSTOM PROCEDURE TRAY) ×4 IMPLANT
PACK CYSTO (CUSTOM PROCEDURE TRAY) ×4 IMPLANT
PROBE LITHOCLAST ULTRA 3.8X403 (UROLOGICAL SUPPLIES) ×4 IMPLANT
PROBE PNEUMATIC 1.0MMX570MM (UROLOGICAL SUPPLIES) ×4 IMPLANT
SHEATH PEELAWAY SET 9 (SHEATH) ×4 IMPLANT
SPONGE LAP 4X18 X RAY DECT (DISPOSABLE) ×4 IMPLANT
STENT CONTOUR 7FRX26X.038 (STENTS) ×4 IMPLANT
STONE CATCHER W/TUBE ADAPTER (UROLOGICAL SUPPLIES) ×4 IMPLANT
SUT SILK 0 FSL (SUTURE) ×4 IMPLANT
SYR 20CC LL (SYRINGE) ×8 IMPLANT
SYRINGE 10CC LL (SYRINGE) ×4 IMPLANT
SYRINGE 60CC LL (MISCELLANEOUS) ×4 IMPLANT
TOWEL OR 17X26 10 PK STRL BLUE (TOWEL DISPOSABLE) ×4 IMPLANT
TOWEL OR NON WOVEN STRL DISP B (DISPOSABLE) ×4 IMPLANT
TUBING CONNECTING 10 (TUBING) ×3 IMPLANT
TUBING CONNECTING 10' (TUBING) ×1

## 2014-08-15 NOTE — H&P (Signed)
Reason For Visit complete left sided staghorn   History of Present Illness 69F referred by Harrison Mons, PA for eval and management of recurrent hemorrhagic cystitis.  Patient presented to PCP approximately 1 month ago with recurrent hematuria and passing "mucous" per urethra. She had few voiding symptoms at the time. She noted blood on the toliet paper as she wiped herself and the urine was pink. She was found to have > 100K proteus in her urine. She had a similar urine culture in October 2015. She has a known left sided partial staghorn that was first recognized in 2012 for which she has not had any treatment.   The patient states that each time she's been diagnosed with a urinary tract infection symptoms are mostly just passing mucus in the toilet. She denies any progressive voiding symptoms like worsening urgency or frequency. She denies any progressive incontinence. She has had microscopic hematuria for several times although has not been referred for this previously. She was told that she had a kidney stone 2012 although she was not given any sense of urgency. She currently denies any flank pain, fevers, or chills. She has no dysuria.   Intv: Repeat CT scan done showing complete staghorn calculi. No ongoing voiding symptoms. No hematuria or flank pain.   Past Medical History Problems  1. History of Diverticulosis (K57.90) 2. History of colon polyps (Z86.010) 3. History of diabetes mellitus (Z86.39) 4. History of esophageal reflux (Z87.19) 5. History of hyperglycemia (Z86.39) 6. History of hyperlipidemia (Z86.39) 7. History of hypertension (Z86.79) 8. History of hypothyroidism (Z86.39) 9. History of Low bone mass (M85.80) 10. History of Vitamin D insufficiency (E55.9)  Surgical History Problems  1. History of Colonoscopy (Fiberoptic) 2. History of Gallbladder Surgery 3. History of Hysterectomy 4. History of Upper Gastrointestinal Endoscopy (Therapeutic)  Current Meds 1.  Atorvastatin Calcium 40 MG Oral Tablet;  Therapy: (Recorded:03Feb2016) to Recorded 2. Hydrochlorothiazide 25 MG Oral Tablet;  Therapy: (Recorded:03Feb2016) to Recorded 3. Levothyroxine Sodium 88 MCG Oral Tablet;  Therapy: (Recorded:03Feb2016) to Recorded  Allergies Medication  1. Actonel TABS  Family History Problems  1. Family history of Death of family member : Mother, Father 2. Family history of cardiac disorder (Z82.49) : Father 3. Family history of diabetes mellitus (Z83.3) : Mother 4. Family history of kidney stones (Z84.1) : Brother 5. Family history of stroke (Z82.3) : Brother  Social History Problems  1. Denied: History of Alcohol use 2. Current every day smoker (F17.200)   0.80 ppd (ciggs) for 50 years 3. Death in the family, father   age 23 CHF 68. Death in the family, mother   age 69 broken hip 38. Divorced 6. No caffeine use 7. Retired  Engineer, site Vital Signs [Data Includes: Last 1 Day]  Recorded: 23Feb2016 09:41AM  Blood Pressure: 121 / 79 Temperature: 97.9 F Heart Rate: 82  Physical Exam Constitutional: Well nourished and well developed . No acute distress.  ENT:. The ears and nose are normal in appearance.  Neck: The appearance of the neck is normal and no neck mass is present.  Pulmonary: No respiratory distress and normal respiratory rhythm and effort.  Cardiovascular: Heart rate and rhythm are normal . No peripheral edema.  Abdomen: The abdomen is soft and nontender. No masses are palpated. No CVA tenderness. No hernias are palpable. No hepatosplenomegaly noted.  Skin: Normal skin turgor, no visible rash and no visible skin lesions.  Neuro/Psych:. Mood and affect are appropriate.    Results/Data Urine [Data Includes: Last 1 Day]  [id number]  COLOR YELLOW   APPEARANCE CLOUDY   SPECIFIC GRAVITY 1.015   pH 6.0   GLUCOSE NEG mg/dL  BILIRUBIN NEG   KETONE NEG mg/dL  BLOOD SMALL   PROTEIN NEG mg/dL  UROBILINOGEN 0.2 mg/dL  NITRITE NEG    LEUKOCYTE ESTERASE LARGE   SQUAMOUS EPITHELIAL/HPF RARE   WBC 21-50 WBC/hpf  RBC NONE SEEN RBC/hpf  BACTERIA FEW   CRYSTALS NONE SEEN   CASTS NONE SEEN   Selected Results  AU CT-STONE PROTOCOL [id number] 12:00AM Louis Meckel   Test Name Result Flag Reference  CT-STONE PROTOCOL (Report)    ** RADIOLOGY REPORT BY [facility] **   CLINICAL DATA: Gross hematuria began 3 months ago. Two episodes since.  EXAM: CT ABDOMEN AND PELVIS WITHOUT CONTRAST  TECHNIQUE: Multidetector CT imaging of the abdomen and pelvis was performed following the standard protocol without IV contrast.  COMPARISON: CT scan 01/11/2011  FINDINGS: Lower chest: The visualized lung bases are clear. The heart is not visualized.  Hepatobiliary: The unenhanced appearance of the liver is unremarkable. No focal hepatic lesions or intrahepatic biliary dilatation. The gallbladder surgically absent. No common bile duct dilatation.  Pancreas: Normal  Spleen: Normal  Adrenals/Urinary Tract: Bilateral adrenal gland adenomas are noted. There is a large staghorn type calculus filling almost the entire left renal collecting system. This was also noted on the prior CT scan. This likely accounts for the patient's hematuria. No ureteral calculi or bladder calculi.  Stomach/Bowel: The stomach, duodenum, small bowel and colon are grossly normal without oral contrast. No inflammatory changes, mass lesions or obstructive findings. The terminal ileum and appendix are normal. There are scattered colonic diverticuli but no findings for acute diverticulitis.  Vascular/Lymphatic: No mesenteric or retroperitoneal mass or adenopathy. Moderate to advanced atherosclerotic calcifications involving the aorta branch vessels. No focal aneurysm.  Reproductive: The uterus is surgically absent. Both ovaries are still present and appear normal except for small cysts.  Other: No pelvic mass or adenopathy. No free  pelvic fluid collections. The bladder appears normal. No inguinal mass or adenopathy.  Musculoskeletal: No significant bony findings.  IMPRESSION: 1. Large staghorn type left renal calculus occupying almost the entire left renal collecting system and likely accounting for the patient's hematuria. Small lower pole right renal calculus. No obstructing ureteral calculi. No bladder calculi. 2. No acute abdominal/pelvic findings, mass lesions or adenopathy. 3. Moderate to advanced atherosclerotic calcifications involving the aorta and iliac arteries.   Electronically Signed  By: Marijo Sanes M.D.  On: 05/30/2014 14:58    I have reviewed the patient's CT scan - shows a complete stagehorn calculus on the left side.  Assessment Assessed  1. Left nephrolithiasis (N20.0)  Plan Health Maintenance  1. UA With REFLEX; [Do Not Release]; Status:Complete;   Done: [id number] 09:14AM Recurrent urinary tract infection  2. Follow-up Schedule Surgery Office  Follow-up  Status: Hold For - Appointment   Requested for: [phone] 3. URINE CULTURE; Status:In Progress - Specimen/Data Collected;   Done: [id number]  Discussion/Summary I discussed the significance of a complete staghorn stone on the left. I explained to her that to leave this stone would make her susceptible to recurrent UTIs, pyelonephritis, XGP and loss of kidney function. I recommended that we perform PCNL in order to treat her stone. I suspect that this would require multi-access tracks and a staged operation. I counciled her on the risks and benefits inlcuding damage to the spleen, colon, renal hemorrhage, infection, and need to perform multiple operations. The encouraged  the patient to stop smoking - which she adamently declined. we will get her scheduled as soon as possible.

## 2014-08-15 NOTE — Anesthesia Preprocedure Evaluation (Signed)
Anesthesia Evaluation  Patient identified by MRN, date of birth, ID band Patient awake    History of Anesthesia Complications (+) PONV  Airway Mallampati: II  TM Distance: >3 FB Neck ROM: Full    Dental   Pulmonary Current Smoker,  breath sounds clear to auscultation        Cardiovascular hypertension, + Peripheral Vascular Disease Rhythm:Regular Rate:Normal     Neuro/Psych    GI/Hepatic Neg liver ROS, GERD-  ,  Endo/Other  diabetes  Renal/GU Renal disease     Musculoskeletal   Abdominal   Peds  Hematology   Anesthesia Other Findings   Reproductive/Obstetrics                             Anesthesia Physical Anesthesia Plan  ASA: III  Anesthesia Plan: General   Post-op Pain Management:    Induction: Intravenous  Airway Management Planned: LMA  Additional Equipment:   Intra-op Plan:   Post-operative Plan: Extubation in OR  Informed Consent: I have reviewed the patients History and Physical, chart, labs and discussed the procedure including the risks, benefits and alternatives for the proposed anesthesia with the patient or authorized representative who has indicated his/her understanding and acceptance.   Dental advisory given  Plan Discussed with: CRNA and Anesthesiologist  Anesthesia Plan Comments:         Anesthesia Quick Evaluation

## 2014-08-15 NOTE — Anesthesia Procedure Notes (Signed)
Procedure Name: Intubation Date/Time: 08/15/2014 7:48 AM Performed by: Sherian Maroon A Pre-anesthesia Checklist: Patient identified Patient Re-evaluated:Patient Re-evaluated prior to inductionOxygen Delivery Method: Circle system utilized Preoxygenation: Pre-oxygenation with 100% oxygen Intubation Type: IV induction Ventilation: Mask ventilation without difficulty Laryngoscope Size: Mac and 3 Grade View: Grade II Tube type: Oral Tube size: 7.5 mm Number of attempts: 1 Airway Equipment and Method: Stylet Placement Confirmation: ETT inserted through vocal cords under direct vision,  positive ETCO2 and breath sounds checked- equal and bilateral Secured at: 21 cm Tube secured with: Tape Dental Injury: Teeth and Oropharynx as per pre-operative assessment

## 2014-08-15 NOTE — Transfer of Care (Signed)
Immediate Anesthesia Transfer of Care Note  Patient: Erika Mason  Procedure(s) Performed: Procedure(s): LEFT PERCUTANEOUS NEPHROLITHOTOMY WITH TWO SURGEON'S ACCESS  (Left) HOLMIUM LASER APPLICATION (N/A)  Patient Location: PACU  Anesthesia Type:General  Level of Consciousness: awake, alert , oriented and patient cooperative  Airway & Oxygen Therapy: Patient Spontanous Breathing and Patient connected to face mask oxygen  Post-op Assessment: Report given to RN and Post -op Vital signs reviewed and stable  Post vital signs: Reviewed and stable  Last Vitals:  Filed Vitals:   08/15/14 0510  BP: 137/74  Pulse: 95  Temp: 36.4 C  Resp: 18    Complications: No apparent anesthesia complications

## 2014-08-15 NOTE — Anesthesia Postprocedure Evaluation (Signed)
  Anesthesia Post-op Note  Patient: Erika Mason  Procedure(s) Performed: Procedure(s): LEFT PERCUTANEOUS NEPHROLITHOTOMY WITH TWO SURGEON'S ACCESS  (Left) HOLMIUM LASER APPLICATION (N/A)  Patient Location: PACU  Anesthesia Type:General  Level of Consciousness: awake  Airway and Oxygen Therapy: Patient Spontanous Breathing  Post-op Pain: mild  Post-op Assessment: Post-op Vital signs reviewed  Post-op Vital Signs: Reviewed  Last Vitals:  Filed Vitals:   08/15/14 1400  BP: 96/56  Pulse:   Temp: 36.6 C  Resp: 24    Complications: No apparent anesthesia complications

## 2014-08-15 NOTE — Discharge Instructions (Signed)
Discharge instructions following PCNL  Call your doctor for: Fevers greater than 100.5 Severe nausea or vomiting Increasing pain not controlled by pain medication Increasing redness or drainage from incisions Decreased urine output or a catheter is no longer draining  The number for questions is [phone].  Activity: Gradually increase activity with short frequent walks, 3-4 times a day.  Avoid strenuous activities, like sports, lawn-mowing, or heavy lifting (more than 10-15 pounds).  Wear loose, comfortable clothing that pull or kink the tube or tubes.  Do not drive while taking pain medication, or until your doctor permitts it.  Bathing and dressing changes: You should not shower for 48 hours after surgery.  Do not soak your back in a bathtub.  Drainage bag care: The drainage bag should be secured such that it never pulls or loosens to prevent it from leaking.  It is important to wash her hands before and after emptying the drainage bag to help prevent the spread of infection.  The drainage bag should be emptied as needed.  When the wound stops draining or it is manageable with a dry gauze dressing, you can remove the bag.  Diet: It is extremely important to drink plenty of fluids after surgery, especially water.  You may resume your regular diet, unless otherwise instructed.  Medications: May take Tylenol (acetaminophen) or ibuprofen (Advil, Motrin) as directed over-the-counter. Take any prescriptions as directed.  Follow-up appointments: Follow-up appointment will be scheduled with Dr. Louis Meckel in 7 days for repeat procedure.

## 2014-08-15 NOTE — Op Note (Signed)
Preoperative diagnosis:  1. Left staghorn calculi  Postoperative diagnosis:  1. Same   Procedure: 1. Percutaneous renal access 2 2. Percutaneous nephrolithotomy 3. Retrograde pyelogram with interpretation 4. Anterior ureteral stent placement 5. Placement of a nephrostomy tube 6. Completion nephrostogram  Surgeon: Ardis Hughs, MD Assistant: Curt Bears, MD  Anesthesia: General  Complications: None  Intraoperative findings: We obtained to renal access one in the lower pole and one in the upper pole. The interpolar region was difficult access and stone remains in that calyx. Most of the upper pole stone lower pole stone and renal pelvic stone was removed. The retrograde pyelogram demonstrated a normal caliber ureter. The stone did take up the entire collecting system, although there was contrast in and around the stone when gentle pressure was provided with the contrast.  EBL: 300 mL's  Specimens: None  Indication: Erika Mason is a 69 y.o. patient with complete left-sided staghorn calculi.  After reviewing the management options for treatment, he elected to proceed with the above surgical procedure(s). We have discussed the potential benefits and risks of the procedure, side effects of the proposed treatment, the likelihood of the patient achieving the goals of the procedure, and any potential problems that might occur during the procedure or recuperation. Informed consent has been obtained.  Description of procedure:  The patient was taken to the operating room and general anesthesia was induced.  She was then intubated and then placed prone on the operating room table. Her legs were split leg. All bony prominences were padded. The patient was then prepped and draped in the routine sterile fashion. A timeout was held.  We started by taking a flexible cystoscope and inserting into the patient's urethra. We then advanced a wire, 0.38 sensor wire, up the left ureter. We  then backed out the cystoscope and passed a open-ended ureteral catheter over the wire and into the proximal ureter. We then performed retrograde pyelogram. The above findings were then documented. We then inserted a Foley catheter.  We then turned our attention to the percutaneous renal access. This was performed using the pullback technique. The C-arm was rotated to 25 and contrast was injected into the retrograde catheter. We opted to obtain access in the lower pole.  Our first stick had clear yellow urine return. A wire was then passed down the ureter and into the bladder. We then exchanged the wire using a angiographic catheter with a superstiff wire. We then passed a 8/10 dilator over the wire and under fluoroscopic guidance passed it across the UPJ. We then removed the inner portion of the dilator and then advanced a second sensor wire through the dilator and down the ureter and into the bladder. We then exchanged the sensor wire for a superstiff wire using an angiographic catheter in similar fashion. At this point we then repeated the retrograde and noted that our access was slightly lateral to the calyx in the lower pole and as such we attempted to gain access in a more central fashion. After numerous failed attempts at obtaining better access, we attempted to perform ureteroscopic guided access. This proved difficult as we were not able to get the ureteroscope beyond the UPJ because of the staghorn calculi. I such, we opted to dilate this access track. We did this using a 73 [place] NephroMax balloon and then passing a renal access sheath over the balloon under fluoroscopic guidance. We then started with a 2 prong grasper and removed all clots and precaval tissue. We  then were able to use the lithoclast and took all of the stone that was within the reach of the access sheath without significant torque.  We then used the cystoscope to locate the access point for the upper pole. This proved difficult  again as we are unable to get the cystoscope up to the calyx initially. We then used a 200  fiber and lasered the stone into smaller fragments so that I could advance the cystoscope into the upper pole. Once in the upper pole we were able to use nephroscopic guided access again using the [place] technique with fluoroscopy. Once we were able to gain access into the upper pole we removed the coaxial needle inner portion and advanced a wire down through the needle and into the bladder under fluoroscopic guidance. We then exchanged the sensor wire with the Super Stiff wire over a angiographic catheter. We then dilated this tract using an 8/10 French dilator and then removed the inner portion to pass a second wire down into the bladder. Again, we exchanged this sensor wire with a superstiff wire using an angiographic catheter. Once we did had 2 superstiff wires in the upper pole we dilated it using a 30 [place] NephroMax balloon dilator and then advancing a sheath over it and into the upper pole calyx under fluoroscopic guidance. This point we are able to remove the residual fragments using 2 prong grasper.  This point we continued to remove fragments from both access points. We were unable to gain access into the interpolar region. All visible stone fragments were removed however. This point given the amount of time the patient had been under anesthesia we opted to place a nephrostomy tube through the lower pole access. Prior to doing this we passed a stent through the upper pole access in an antegrade fashion down into the bladder using fluoroscopic guidance. There was a curl noted in the bladder as well as in the renal pelvis. We placed 7 French 26 cm stent.   At this point we passed a 24 [place] Ainsworth catheter over the working wire into the lower pole access and advanced it to the UPJ. We then injected 54mL of water and contrast into the balloon of the catheter and performed a completion nephrostogram using  60 mL of Cystografin. This demonstrated no evidence of extravasation.   We then slid a 5 [place] open-ended ureteral catheter through the [place] catheter over the working wire and into the proximal ureter. We then removed the working wire. We then removed the access sheath and cut this off of the catheter. We then injected 30 mL of local numbing medication into both access tracts. At this point we removed the access sheath in the upper tract and noted no significant bleeding. As such, this was closed with a 2-0 silk stitch. The  nephrostomy tube was secured to the skin using a 2-0 silk stitch. The tube was then padded with 4 x 4 and ABDs followed by large Tegaderm. The patient was subsequently awoken and returned to PACU next condition.  Ardis Hughs, M.D.

## 2014-08-15 NOTE — OR Nursing (Signed)
Left Kidney Stone sent with Dr. Louis Meckel. Orest Dikes, RN [phone], 08/15/2014

## 2014-08-16 ENCOUNTER — Observation Stay (HOSPITAL_COMMUNITY): Payer: Commercial Managed Care - HMO

## 2014-08-16 DIAGNOSIS — N200 Calculus of kidney: Secondary | ICD-10-CM | POA: Diagnosis not present

## 2014-08-16 DIAGNOSIS — E039 Hypothyroidism, unspecified: Secondary | ICD-10-CM | POA: Diagnosis not present

## 2014-08-16 DIAGNOSIS — K5790 Diverticulosis of intestine, part unspecified, without perforation or abscess without bleeding: Secondary | ICD-10-CM | POA: Diagnosis not present

## 2014-08-16 DIAGNOSIS — Z960 Presence of urogenital implants: Secondary | ICD-10-CM | POA: Diagnosis not present

## 2014-08-16 DIAGNOSIS — E785 Hyperlipidemia, unspecified: Secondary | ICD-10-CM | POA: Diagnosis not present

## 2014-08-16 DIAGNOSIS — K219 Gastro-esophageal reflux disease without esophagitis: Secondary | ICD-10-CM | POA: Diagnosis not present

## 2014-08-16 DIAGNOSIS — Z86010 Personal history of colonic polyps: Secondary | ICD-10-CM | POA: Diagnosis not present

## 2014-08-16 DIAGNOSIS — I10 Essential (primary) hypertension: Secondary | ICD-10-CM | POA: Diagnosis not present

## 2014-08-16 DIAGNOSIS — Z9889 Other specified postprocedural states: Secondary | ICD-10-CM | POA: Diagnosis not present

## 2014-08-16 DIAGNOSIS — E119 Type 2 diabetes mellitus without complications: Secondary | ICD-10-CM | POA: Diagnosis not present

## 2014-08-16 DIAGNOSIS — E559 Vitamin D deficiency, unspecified: Secondary | ICD-10-CM | POA: Diagnosis not present

## 2014-08-16 LAB — BASIC METABOLIC PANEL
ANION GAP: 9 (ref 5–15)
BUN: 20 mg/dL (ref 6–20)
CHLORIDE: 102 mmol/L (ref 101–111)
CO2: 26 mmol/L (ref 22–32)
Calcium: 8.3 mg/dL — ABNORMAL LOW (ref 8.9–10.3)
Creatinine, Ser: 1.66 mg/dL — ABNORMAL HIGH (ref 0.44–1.00)
GFR calc Af Amer: 35 mL/min — ABNORMAL LOW (ref >60)
GFR calc non Af Amer: 30 mL/min — ABNORMAL LOW (ref >60)
GLUCOSE: 141 mg/dL — AB (ref 70–99)
Potassium: 3.9 mmol/L (ref 3.5–5.1)
SODIUM: 137 mmol/L (ref 135–145)

## 2014-08-16 LAB — HEMOGLOBIN AND HEMATOCRIT, BLOOD
HCT: 25 % — ABNORMAL LOW (ref 36.0–46.0)
HEMATOCRIT: 24.5 % — AB (ref 36.0–46.0)
HEMOGLOBIN: 8.3 g/dL — AB (ref 12.0–15.0)
Hemoglobin: 8.7 g/dL — ABNORMAL LOW (ref 12.0–15.0)

## 2014-08-16 NOTE — Progress Notes (Signed)
Utilization Review completed.  

## 2014-08-16 NOTE — Progress Notes (Signed)
1 Day Post-Op Subjective: Patient reports she is doing well. No complaints.   Objective: Vital signs in last 24 hours: Temp:  [97.3 F (36.3 C)-98.6 F (37 C)] 98.6 F (37 C) (05/07 0500) Pulse Rate:  [84-99] 92 (05/07 0500) Resp:  [13-24] 16 (05/07 0500) BP: (96-124)/(44-62) 102/53 mmHg (05/07 0730) SpO2:  [93 %-100 %] 95 % (05/07 0500)  Intake/Output from previous day: 05/06 0701 - 05/07 0700 In: 5535 [P.O.:360; I.V.:4675; IV Piggyback:500] Out: 2390 [Urine:2090; Blood:300] Intake/Output this shift: Total I/O In: 240 [P.O.:240] Out: -   Physical Exam:  NAD Watching TV GU: urine in foley and left Nx light pink  Procedure - the left Nx tube balloon deflated. No bleeding. Nx tube backed out over 5Fr open ended catheter. No bleeding. Clear urine drip. Watched for a minute or two. 5Fr open ended removed. Pt tolerated well. Clear urine drip minimal. Dressing placed.   Lab Results:  Recent Labs  08/15/14 1450 08/16/14 0516  HGB 10.6* 8.7*  HCT 32.7* 25.0*   BMET  Recent Labs  08/15/14 1420 08/16/14 0516  NA 140 137  K 3.2* 3.9  CL 103 102  CO2 23 26  GLUCOSE 179* 141*  BUN 18 20  CREATININE 1.67* 1.66*  CALCIUM 8.1* 8.3*   No results for input(s): LABPT, INR in the last 72 hours. No results for input(s): LABURIN in the last 72 hours. Results for orders placed or performed during the hospital encounter of 08/07/14  Urine culture     Status: None   Collection Time: 08/07/14 10:15 AM  Result Value Ref Range Status   Specimen Description URINE, RANDOM  Final   Special Requests NONE  Final   Colony Count NO GROWTH Performed at [facility]   Final   Culture NO GROWTH Performed at [facility]   Final   Report Status 08/07/2014 FINAL  Final    Studies/Results: Ct Abdomen Pelvis Wo Contrast  08/16/2014   CLINICAL DATA:  Left staghorn calculus. Low dose protocol specified in order.S/P recent: Percutaneous renal access 2Percutaneous  nephrolithotomyRetrograde pyelogram with interpretationAnterior ureteral stent placementPlacement of a nephrostomy tubeCompletion nephrostogram (OP note posted 08-15-2014)  EXAM: CT ABDOMEN AND PELVIS WITHOUT CONTRAST  TECHNIQUE: Multidetector CT imaging of the abdomen and pelvis was performed following the standard protocol without IV contrast.  COMPARISON:  CT, 05/30/2014  FINDINGS: Nephrostomy catheter curls within the lower pole collecting system of the left kidney. Ureteral stent extends from the left renal pelvis to the bladder. Much of the previously seen stand horn calculus is no longer present. There are fragments of the staghorn calculus in the intrarenal collecting system on the left, in the mid and upper pole. Small stones are also seen in the left renal pelvis adjacent to the ureteral stent.  There is heterogeneous attenuation of the left kidney. Stranding is noted in the perinephric fat and there is high attenuation material and some air tracking along the anterior para renal fascia consistent with hemorrhage.  No significant left hydronephrosis.  No right renal mass or stone. No hydronephrosis. Normal right ureter. Bladder is decompressed by a Foley catheter.  Small left pleural effusion with associated dependent atelectasis. Clear right lung base. Heart normal in size.  Liver, spleen, pancreas:  Unremarkable.  Gallbladder surgically absent.  No bile duct dilation.  Bilateral adrenal adenomas, stable.  Uterus surgically absent. No pelvic masses. Left ovary is prominent measuring 4.7 cm, increased from 2.9 cm previously. Ovary is relatively low attenuation. This mild  enlargement is likely the cysts. Consider further evaluation with pelvic ultrasound on a nonemergent basis.  No pathologically enlarged lymph nodes.  No ascites.  Scattered sigmoid colon diverticula. No diverticulitis. Remainder of the colon is unremarkable. Normal small bowel. Normal appendix.  Degenerative changes noted of the visualized  spine. No osteoblastic or osteolytic lesions.  IMPRESSION: 1. Status post left percutaneous nephrostomy. Much of the large staghorn calculus seen previously has been removed. There are residual pieces of calculi within the left intrarenal collecting system. No significant collecting system dilation. The nephrostomy tube and the ureteral stent are well positioned. 2. There is postprocedure perinephric and para renal hemorrhage, small in amount. Some postprocedure air is also noted in the perinephric space. 3. Small left pleural effusion with associated dependent atelectasis. 4. Left ovary mildly enlarged for patient of this age. Consider followup pelvic ultrasound on a nonemergent basis.   Electronically Signed   By: Lajean Manes M.D.   On: 08/16/2014 08:55   Dg Abd 1 View  08/15/2014   CLINICAL DATA:  Kidney stones.  Percutaneous nephrolithotomy.  EXAM: ABDOMEN - 1 VIEW  COMPARISON:  CT 05/30/2014.  FINDINGS: Fluoroscopy time 8 min 55 seconds. Seven images. Contrast, guide wires, and nephrostomy catheter noted projected over the left kidney. Filling defect in the left kidney noted consistent with staghorn calculus.  IMPRESSION: Intraoperative images left kidney obtained as above. Left staghorn calculus .   Electronically Signed   By: Marcello Moores  Register   On: 08/15/2014 13:50   Dg Chest Port 1 View  08/15/2014   CLINICAL DATA:  Postop cough.  History of COPD, smoking.  EXAM: PORTABLE CHEST - 1 VIEW  COMPARISON:  08/16/2012 CT.  FINDINGS: There is hyperinflation of the lungs compatible with COPD. Airspace opacity noted in the lingula. Right lung is clear. No effusions. Heart is normal size. Mediastinal contours are within normal limits.  IMPRESSION: Lingular opacity could reflect pneumonia.  COPD.   Electronically Signed   By: Rolm Baptise M.D.   On: 08/15/2014 14:32   Dg C-arm 1-60 Min-no Report  08/15/2014   CLINICAL DATA: surgery   C-ARM 1-60 MINUTES  Fluoroscopy was utilized by the requesting physician.  No  radiographic  interpretation.      Postop day 1-left PCNL with two access-I reviewed CT scan which showed excellent resolution of the majority of the stone. There were some remaining fragments in the upper and lower pole but these will be managed ureteroscopically. Patient hemoglobin down some. Plan- -DC nephrostomy tube and open-ended ureteral catheter (done by MD), continue Foley overnight to encourage tract closure.  -Check H&H this evening and in the a.m. and if stable discharge tomorrow -Continue Cipro    [place], [place] 08/16/2014, 9:04 AM

## 2014-08-17 DIAGNOSIS — E785 Hyperlipidemia, unspecified: Secondary | ICD-10-CM | POA: Diagnosis not present

## 2014-08-17 DIAGNOSIS — Z86010 Personal history of colonic polyps: Secondary | ICD-10-CM | POA: Diagnosis not present

## 2014-08-17 DIAGNOSIS — E119 Type 2 diabetes mellitus without complications: Secondary | ICD-10-CM | POA: Diagnosis not present

## 2014-08-17 DIAGNOSIS — E559 Vitamin D deficiency, unspecified: Secondary | ICD-10-CM | POA: Diagnosis not present

## 2014-08-17 DIAGNOSIS — K5790 Diverticulosis of intestine, part unspecified, without perforation or abscess without bleeding: Secondary | ICD-10-CM | POA: Diagnosis not present

## 2014-08-17 DIAGNOSIS — E039 Hypothyroidism, unspecified: Secondary | ICD-10-CM | POA: Diagnosis not present

## 2014-08-17 DIAGNOSIS — K219 Gastro-esophageal reflux disease without esophagitis: Secondary | ICD-10-CM | POA: Diagnosis not present

## 2014-08-17 DIAGNOSIS — N200 Calculus of kidney: Secondary | ICD-10-CM | POA: Diagnosis not present

## 2014-08-17 DIAGNOSIS — I10 Essential (primary) hypertension: Secondary | ICD-10-CM | POA: Diagnosis not present

## 2014-08-17 LAB — CBC
HCT: 23.9 % — ABNORMAL LOW (ref 36.0–46.0)
HEMOGLOBIN: 8 g/dL — AB (ref 12.0–15.0)
MCH: 30.5 pg (ref 26.0–34.0)
MCHC: 33.5 g/dL (ref 30.0–36.0)
MCV: 91.2 fL (ref 78.0–100.0)
Platelets: 179 10*3/uL (ref 150–400)
RBC: 2.62 MIL/uL — AB (ref 3.87–5.11)
RDW: 14 % (ref 11.5–15.5)
WBC: 16.2 10*3/uL — ABNORMAL HIGH (ref 4.0–10.5)

## 2014-08-17 LAB — BASIC METABOLIC PANEL
ANION GAP: 10 (ref 5–15)
BUN: 18 mg/dL (ref 6–20)
CO2: 23 mmol/L (ref 22–32)
CREATININE: 1.19 mg/dL — AB (ref 0.44–1.00)
Calcium: 8.3 mg/dL — ABNORMAL LOW (ref 8.9–10.3)
Chloride: 108 mmol/L (ref 101–111)
GFR calc Af Amer: 53 mL/min — ABNORMAL LOW (ref >60)
GFR, EST NON AFRICAN AMERICAN: 45 mL/min — AB (ref >60)
Glucose, Bld: 113 mg/dL — ABNORMAL HIGH (ref 70–99)
Potassium: 3.5 mmol/L (ref 3.5–5.1)
Sodium: 141 mmol/L (ref 135–145)

## 2014-08-17 MED ORDER — OXYCODONE HCL 5 MG PO TABS: 5.0000 mg | ORAL_TABLET | ORAL | Status: DC | PRN

## 2014-08-17 MED ORDER — CIPROFLOXACIN HCL 500 MG PO TABS: 500.0000 mg | ORAL_TABLET | Freq: Two times a day (BID) | ORAL | Status: DC

## 2014-08-17 NOTE — Progress Notes (Signed)
IV begin to leak, pt did not want the IV restarted since she maybe discharged today. MD notified and IV will remain out. SRP, RN

## 2014-08-17 NOTE — Discharge Summary (Signed)
Physician Discharge Summary  Patient ID: Erika Mason MRN: [id number] DOB/AGE: [birth year] 69 y.o.  Admit date: 08/15/2014 Discharge date: 08/17/2014  Admission Diagnoses: Nephrolithiasis  Discharge Diagnoses:  Active Problems:   Nephrolithiasis   Discharged Condition: good  Hospital Course: Patient admitted following Left PCNL. She has done well. Left Nx tube removed POD#1. On POD#2, left flank drainage has tapered off. H/H and electrolytes stable/normal. Kidney function returning to baseline.   Consults: None  Significant Diagnostic Studies: CT A/P - residual stone in upper and lower pole.   Treatments: surgery: LEFT PCNL, left ureteral stent placement   Discharge Exam: Blood pressure 117/56, pulse 111, temperature 98.3 F (36.8 C), temperature source Oral, resp. rate 18, height 5\' 3"  (1.6 m), weight 66.679 kg (147 lb), SpO2 94 %. Pt sitting on edge of bed dressed and ready to go. Looks well. Minimal drainage in left flank ostomy bag. PCNL site - without E/E/I.   Disposition: 01-Home or Self Care - plan is for second stage Left ureteroscopy. Pt has a left ureteral stent in place.   Discharge Instructions    Discharge patient    Complete by:  As directed             Medication List    TAKE these medications        albuterol 108 (90 BASE) MCG/ACT inhaler  Commonly known as:  PROVENTIL HFA;VENTOLIN HFA  Inhale 2 puffs into the lungs every 4 (four) hours as needed for wheezing or shortness of breath.     atorvastatin 40 MG tablet  Commonly known as:  LIPITOR  TAKE 1 TABLET DAILY (NEED CHECK UP FOR ADDITIONAL REFILLS)     ciprofloxacin 500 MG tablet  Commonly known as:  CIPRO  Take 1 tablet (500 mg total) by mouth 2 (two) times daily.     hydrochlorothiazide 25 MG tablet  Commonly known as:  HYDRODIURIL  Take 1 tablet (25 mg total) by mouth daily. PATIENT NEEDS CHECK UP FOR ADDITIONAL REFILLS     levothyroxine 88 MCG tablet  Commonly known as:  SYNTHROID,  LEVOTHROID  TAKE 1 TABLET BY MOUTH DAILY.     oxyCODONE 5 MG immediate release tablet  Commonly known as:  Oxy IR/ROXICODONE  Take 1 tablet (5 mg total) by mouth every 4 (four) hours as needed for moderate pain.     pantoprazole 40 MG tablet  Commonly known as:  PROTONIX  TAKE 1 TABLET BY MOUTH EVERY DAY     sodium chloride 0.65 % Soln nasal spray  Commonly known as:  OCEAN  Place 1 spray into both nostrils as needed for congestion.           Follow-up Information    Follow up with Ardis Hughs, MD In 1 week.   Specialty:  Urology   Why:  second stage procedure for stones - already scheduled    Contact information:   [place] [address] [phone]       Signed: Festus Aloe 08/17/2014, 10:49 AM

## 2014-08-17 NOTE — Progress Notes (Signed)
Pt with moderate amount of drainage from Nephrostomy site, skin around is beginning to get red and irritated. Removed saturated dressing and applied an ostomy drainage bag to site to collect urine/drainage. Tol well. Place drainage bag and since placement site has drained a total of 450 cc of bld tinged urine.

## 2014-08-17 NOTE — Progress Notes (Signed)
Nephro drainage site decreased, report given to day RN and he will applied sterile drsgs to the site. SRP, RN

## 2014-08-18 ENCOUNTER — Encounter (HOSPITAL_COMMUNITY): Payer: Self-pay | Admitting: Urology

## 2014-08-18 DIAGNOSIS — N200 Calculus of kidney: Secondary | ICD-10-CM | POA: Diagnosis not present

## 2014-08-19 ENCOUNTER — Other Ambulatory Visit: Payer: Self-pay | Admitting: Urology

## 2014-08-20 ENCOUNTER — Encounter (HOSPITAL_COMMUNITY): Payer: Self-pay | Admitting: *Deleted

## 2014-08-20 ENCOUNTER — Other Ambulatory Visit (HOSPITAL_COMMUNITY): Payer: Self-pay | Admitting: Urology

## 2014-08-20 NOTE — Progress Notes (Signed)
PST PHONE INTERVIEW--- cbc, bmet 08/17/14-  Abnormal,  Ct adb/pelvis 5/8 16 epic, ekg  With lov dr Irish Lack 4/16 epic.  Pt states has cough, states is unchanged and not new

## 2014-08-21 MED ORDER — GENTAMICIN SULFATE 40 MG/ML IJ SOLN
5.0000 mg/kg | INTRAMUSCULAR | Status: AC
  Administered 2014-08-22: 333.5 mg via INTRAVENOUS
  Filled 2014-08-21: qty 8.25

## 2014-08-22 ENCOUNTER — Ambulatory Visit (HOSPITAL_COMMUNITY)
Admission: RE | Admit: 2014-08-22 | Discharge: 2014-08-22 | Disposition: A | Discharge status: Home / Self Care | Payer: Commercial Managed Care - HMO | Source: Ambulatory Visit | Attending: Urology | Admitting: Urology

## 2014-08-22 ENCOUNTER — Ambulatory Visit (HOSPITAL_COMMUNITY): Payer: Commercial Managed Care - HMO | Admitting: Certified Registered Nurse Anesthetist

## 2014-08-22 ENCOUNTER — Encounter (HOSPITAL_COMMUNITY)
Admission: RE | Disposition: A | Discharge status: Home / Self Care | Payer: Self-pay | Source: Ambulatory Visit | Attending: Urology

## 2014-08-22 ENCOUNTER — Encounter (HOSPITAL_COMMUNITY): Payer: Self-pay | Admitting: *Deleted

## 2014-08-22 DIAGNOSIS — N200 Calculus of kidney: Secondary | ICD-10-CM | POA: Diagnosis not present

## 2014-08-22 DIAGNOSIS — K219 Gastro-esophageal reflux disease without esophagitis: Secondary | ICD-10-CM | POA: Insufficient documentation

## 2014-08-22 DIAGNOSIS — N3091 Cystitis, unspecified with hematuria: Secondary | ICD-10-CM | POA: Diagnosis present

## 2014-08-22 DIAGNOSIS — Z79899 Other long term (current) drug therapy: Secondary | ICD-10-CM | POA: Insufficient documentation

## 2014-08-22 DIAGNOSIS — Z841 Family history of disorders of kidney and ureter: Secondary | ICD-10-CM | POA: Diagnosis not present

## 2014-08-22 DIAGNOSIS — E039 Hypothyroidism, unspecified: Secondary | ICD-10-CM | POA: Insufficient documentation

## 2014-08-22 DIAGNOSIS — F17200 Nicotine dependence, unspecified, uncomplicated: Secondary | ICD-10-CM | POA: Insufficient documentation

## 2014-08-22 DIAGNOSIS — E785 Hyperlipidemia, unspecified: Secondary | ICD-10-CM | POA: Insufficient documentation

## 2014-08-22 DIAGNOSIS — I739 Peripheral vascular disease, unspecified: Secondary | ICD-10-CM | POA: Insufficient documentation

## 2014-08-22 DIAGNOSIS — I10 Essential (primary) hypertension: Secondary | ICD-10-CM | POA: Insufficient documentation

## 2014-08-22 DIAGNOSIS — K5790 Diverticulosis of intestine, part unspecified, without perforation or abscess without bleeding: Secondary | ICD-10-CM | POA: Diagnosis not present

## 2014-08-22 DIAGNOSIS — Z87442 Personal history of urinary calculi: Secondary | ICD-10-CM | POA: Insufficient documentation

## 2014-08-22 DIAGNOSIS — E119 Type 2 diabetes mellitus without complications: Secondary | ICD-10-CM | POA: Diagnosis not present

## 2014-08-22 DIAGNOSIS — J449 Chronic obstructive pulmonary disease, unspecified: Secondary | ICD-10-CM | POA: Insufficient documentation

## 2014-08-22 DIAGNOSIS — N390 Urinary tract infection, site not specified: Secondary | ICD-10-CM | POA: Diagnosis not present

## 2014-08-22 HISTORY — DX: Chronic obstructive pulmonary disease, unspecified: J44.9

## 2014-08-22 HISTORY — PX: HOLMIUM LASER APPLICATION: SHX5852

## 2014-08-22 HISTORY — PX: CYSTOSCOPY WITH URETEROSCOPY AND STENT PLACEMENT: SHX6377

## 2014-08-22 LAB — BASIC METABOLIC PANEL
Anion gap: 13 (ref 5–15)
BUN: 12 mg/dL (ref 6–20)
CHLORIDE: 98 mmol/L — AB (ref 101–111)
CO2: 27 mmol/L (ref 22–32)
Calcium: 8.6 mg/dL — ABNORMAL LOW (ref 8.9–10.3)
Creatinine, Ser: 1.07 mg/dL — ABNORMAL HIGH (ref 0.44–1.00)
GFR calc Af Amer: 60 mL/min — ABNORMAL LOW (ref >60)
GFR, EST NON AFRICAN AMERICAN: 52 mL/min — AB (ref >60)
Glucose, Bld: 154 mg/dL — ABNORMAL HIGH (ref 65–99)
POTASSIUM: 2.8 mmol/L — AB (ref 3.5–5.1)
SODIUM: 138 mmol/L (ref 135–145)

## 2014-08-22 LAB — CBC
HCT: 24.1 % — ABNORMAL LOW (ref 36.0–46.0)
Hemoglobin: 8 g/dL — ABNORMAL LOW (ref 12.0–15.0)
MCH: 30.5 pg (ref 26.0–34.0)
MCHC: 33.2 g/dL (ref 30.0–36.0)
MCV: 92 fL (ref 78.0–100.0)
Platelets: 298 10*3/uL (ref 150–400)
RBC: 2.62 MIL/uL — ABNORMAL LOW (ref 3.87–5.11)
RDW: 14.3 % (ref 11.5–15.5)
WBC: 11 10*3/uL — AB (ref 4.0–10.5)

## 2014-08-22 LAB — GLUCOSE, CAPILLARY: Glucose-Capillary: 177 mg/dL — ABNORMAL HIGH (ref 65–99)

## 2014-08-22 SURGERY — CYSTOURETEROSCOPY, WITH STENT INSERTION
Anesthesia: General

## 2014-08-22 MED ORDER — EPHEDRINE SULFATE 50 MG/ML IJ SOLN
INTRAMUSCULAR | Status: AC
  Filled 2014-08-22: qty 1

## 2014-08-22 MED ORDER — CIPROFLOXACIN IN D5W 400 MG/200ML IV SOLN
INTRAVENOUS | Status: AC
  Filled 2014-08-22: qty 200

## 2014-08-22 MED ORDER — PHENYLEPHRINE HCL 10 MG/ML IJ SOLN
INTRAMUSCULAR | Status: DC | PRN
  Administered 2014-08-22: 120 ug via INTRAVENOUS
  Administered 2014-08-22 (×4): 80 ug via INTRAVENOUS
  Administered 2014-08-22: 40 ug via INTRAVENOUS
  Administered 2014-08-22: 160 ug via INTRAVENOUS

## 2014-08-22 MED ORDER — LACTATED RINGERS IV SOLN
INTRAVENOUS | Status: DC
Start: 2014-08-22 — End: 2014-08-22
  Administered 2014-08-22: 1000 mL via INTRAVENOUS
  Administered 2014-08-22 (×2): via INTRAVENOUS

## 2014-08-22 MED ORDER — IOHEXOL 300 MG/ML  SOLN
INTRAMUSCULAR | Status: DC | PRN
  Administered 2014-08-22: 7 mL

## 2014-08-22 MED ORDER — LIDOCAINE HCL (CARDIAC) 20 MG/ML IV SOLN
INTRAVENOUS | Status: DC | PRN
  Administered 2014-08-22: 100 mg via INTRAVENOUS

## 2014-08-22 MED ORDER — PHENAZOPYRIDINE HCL 200 MG PO TABS: 200.0000 mg | ORAL_TABLET | Freq: Three times a day (TID) | ORAL | Status: DC | PRN

## 2014-08-22 MED ORDER — KETAMINE HCL 10 MG/ML IJ SOLN
INTRAMUSCULAR | Status: DC | PRN
  Administered 2014-08-22 (×2): 10 mg via INTRAVENOUS
  Administered 2014-08-22: 20 mg via INTRAVENOUS
  Administered 2014-08-22: 10 mg via INTRAVENOUS

## 2014-08-22 MED ORDER — SODIUM CHLORIDE 0.9 % IR SOLN
Status: DC | PRN
  Administered 2014-08-22: 6000 mL

## 2014-08-22 MED ORDER — FENTANYL CITRATE (PF) 100 MCG/2ML IJ SOLN
INTRAMUSCULAR | Status: DC | PRN
  Administered 2014-08-22 (×2): 50 ug via INTRAVENOUS

## 2014-08-22 MED ORDER — EPHEDRINE SULFATE 50 MG/ML IJ SOLN
INTRAMUSCULAR | Status: DC | PRN
  Administered 2014-08-22: 10 mg via INTRAVENOUS
  Administered 2014-08-22: 120 mg via INTRAVENOUS
  Administered 2014-08-22: 5 mg via INTRAVENOUS
  Administered 2014-08-22 (×2): 10 mg via INTRAVENOUS

## 2014-08-22 MED ORDER — PROPOFOL 10 MG/ML IV BOLUS
INTRAVENOUS | Status: DC | PRN
  Administered 2014-08-22: 50 mg via INTRAVENOUS
  Administered 2014-08-22: 200 mg via INTRAVENOUS
  Administered 2014-08-22: 20 mg via INTRAVENOUS
  Administered 2014-08-22: 30 mg via INTRAVENOUS
  Administered 2014-08-22 (×2): 50 mg via INTRAVENOUS
  Administered 2014-08-22: 20 mg via INTRAVENOUS

## 2014-08-22 MED ORDER — TOLTERODINE TARTRATE ER 4 MG PO CP24: 4.0000 mg | ORAL_CAPSULE | Freq: Every day | ORAL | Status: DC

## 2014-08-22 MED ORDER — ONDANSETRON HCL 4 MG/2ML IJ SOLN
INTRAMUSCULAR | Status: AC
  Filled 2014-08-22: qty 2

## 2014-08-22 MED ORDER — GLYCOPYRROLATE 0.2 MG/ML IJ SOLN
INTRAMUSCULAR | Status: DC | PRN
  Administered 2014-08-22 (×2): 0.1 mg via INTRAVENOUS

## 2014-08-22 MED ORDER — CIPROFLOXACIN IN D5W 400 MG/200ML IV SOLN
400.0000 mg | INTRAVENOUS | Status: AC
  Administered 2014-08-22: 400 mg via INTRAVENOUS

## 2014-08-22 MED ORDER — MIDAZOLAM HCL 5 MG/5ML IJ SOLN
INTRAMUSCULAR | Status: DC | PRN
  Administered 2014-08-22: 1 mg via INTRAVENOUS

## 2014-08-22 MED ORDER — PHENYLEPHRINE 40 MCG/ML (10ML) SYRINGE FOR IV PUSH (FOR BLOOD PRESSURE SUPPORT)
PREFILLED_SYRINGE | INTRAVENOUS | Status: AC
  Filled 2014-08-22: qty 10

## 2014-08-22 MED ORDER — PHENYLEPHRINE HCL 10 MG/ML IJ SOLN
10.0000 mg | INTRAVENOUS | Status: DC | PRN
  Administered 2014-08-22: 50 ug/min via INTRAVENOUS

## 2014-08-22 MED ORDER — PROPOFOL 10 MG/ML IV BOLUS
INTRAVENOUS | Status: AC
  Filled 2014-08-22: qty 20

## 2014-08-22 MED ORDER — METOCLOPRAMIDE HCL 5 MG/ML IJ SOLN
INTRAMUSCULAR | Status: AC
  Filled 2014-08-22: qty 2

## 2014-08-22 MED ORDER — PROMETHAZINE HCL 25 MG/ML IJ SOLN: 6.2500 mg | INTRAMUSCULAR | Status: DC | PRN

## 2014-08-22 MED ORDER — METOCLOPRAMIDE HCL 5 MG/ML IJ SOLN
INTRAMUSCULAR | Status: DC | PRN
  Administered 2014-08-22: 10 mg via INTRAVENOUS

## 2014-08-22 MED ORDER — ONDANSETRON HCL 4 MG/2ML IJ SOLN
INTRAMUSCULAR | Status: DC | PRN
  Administered 2014-08-22 (×2): 4 mg via INTRAVENOUS

## 2014-08-22 MED ORDER — DEXAMETHASONE SODIUM PHOSPHATE 10 MG/ML IJ SOLN
INTRAMUSCULAR | Status: AC
  Filled 2014-08-22: qty 1

## 2014-08-22 MED ORDER — GLYCOPYRROLATE 0.2 MG/ML IJ SOLN
INTRAMUSCULAR | Status: AC
  Filled 2014-08-22: qty 1

## 2014-08-22 MED ORDER — KETAMINE HCL 10 MG/ML IJ SOLN
INTRAMUSCULAR | Status: AC
  Filled 2014-08-22: qty 1

## 2014-08-22 MED ORDER — LIDOCAINE HCL (CARDIAC) 20 MG/ML IV SOLN
INTRAVENOUS | Status: AC
  Filled 2014-08-22: qty 5

## 2014-08-22 MED ORDER — PROPOFOL INFUSION 10 MG/ML OPTIME
INTRAVENOUS | Status: DC | PRN
  Administered 2014-08-22: 75 ug/kg/min via INTRAVENOUS

## 2014-08-22 MED ORDER — FENTANYL CITRATE (PF) 100 MCG/2ML IJ SOLN
INTRAMUSCULAR | Status: AC
  Filled 2014-08-22: qty 2

## 2014-08-22 MED ORDER — DEXAMETHASONE SODIUM PHOSPHATE 10 MG/ML IJ SOLN
INTRAMUSCULAR | Status: DC | PRN
  Administered 2014-08-22: 10 mg via INTRAVENOUS

## 2014-08-22 MED ORDER — MIDAZOLAM HCL 2 MG/2ML IJ SOLN
INTRAMUSCULAR | Status: AC
  Filled 2014-08-22: qty 2

## 2014-08-22 MED ORDER — PROPOFOL 10 MG/ML IV BOLUS
INTRAVENOUS | Status: AC
Start: 2014-08-22 — End: 2014-08-22
  Filled 2014-08-22: qty 20

## 2014-08-22 MED ORDER — MEPERIDINE HCL 50 MG/ML IJ SOLN: 6.2500 mg | INTRAMUSCULAR | Status: DC | PRN

## 2014-08-22 MED ORDER — HYDROMORPHONE HCL 1 MG/ML IJ SOLN: 0.2500 mg | INTRAMUSCULAR | Status: DC | PRN

## 2014-08-22 SURGICAL SUPPLY — 19 items
BAG URO CATCHER STRL LF (DRAPE) ×4 IMPLANT
BASKET LASER NITINOL 1.9FR (BASKET) ×4 IMPLANT
BASKET STONE NCOMPASS (UROLOGICAL SUPPLIES) ×4 IMPLANT
BASKET ZERO TIP NITINOL 2.4FR (BASKET) IMPLANT
CATH URET 5FR 28IN OPEN ENDED (CATHETERS) ×4 IMPLANT
CLOTH BEACON ORANGE TIMEOUT ST (SAFETY) ×4 IMPLANT
EXTRACTOR STONE NITINOL NGAGE (UROLOGICAL SUPPLIES) ×8 IMPLANT
FIBER LASER FLEXIVA 200 (UROLOGICAL SUPPLIES) ×4 IMPLANT
FIBER LASER TRAC TIP (UROLOGICAL SUPPLIES) ×4 IMPLANT
GLOVE BIOGEL M STRL SZ7.5 (GLOVE) ×4 IMPLANT
GOWN STRL REUS W/TWL XL LVL3 (GOWN DISPOSABLE) ×4 IMPLANT
GUIDEWIRE ANG ZIPWIRE 038X150 (WIRE) ×4 IMPLANT
GUIDEWIRE STR DUAL SENSOR (WIRE) ×4 IMPLANT
PACK CYSTO (CUSTOM PROCEDURE TRAY) ×4 IMPLANT
SHEATH ACCESS URETERAL 38CM (SHEATH) ×4 IMPLANT
STENT CONTOUR 6FRX24X.038 (STENTS) ×4 IMPLANT
TUBE FEEDING 8FR 16IN STR KANG (MISCELLANEOUS) IMPLANT
TUBING CONNECTING 10 (TUBING) ×3 IMPLANT
TUBING CONNECTING 10' (TUBING) ×1

## 2014-08-22 NOTE — H&P (View-Only) (Signed)
Reason For Visit complete left sided staghorn   History of Present Illness 52F referred by Harrison Mons, PA for eval and management of recurrent hemorrhagic cystitis.  Patient presented to PCP approximately 1 month ago with recurrent hematuria and passing "mucous" per urethra. She had few voiding symptoms at the time. She noted blood on the toliet paper as she wiped herself and the urine was pink. She was found to have > 100K proteus in her urine. She had a similar urine culture in October 2015. She has a known left sided partial staghorn that was first recognized in 2012 for which she has not had any treatment.   The patient states that each time she's been diagnosed with a urinary tract infection symptoms are mostly just passing mucus in the toilet. She denies any progressive voiding symptoms like worsening urgency or frequency. She denies any progressive incontinence. She has had microscopic hematuria for several times although has not been referred for this previously. She was told that she had a kidney stone 2012 although she was not given any sense of urgency. She currently denies any flank pain, fevers, or chills. She has no dysuria.   Intv: Repeat CT scan done showing complete staghorn calculi. No ongoing voiding symptoms. No hematuria or flank pain.   Past Medical History Problems  1. History of Diverticulosis (K57.90) 2. History of colon polyps (Z86.010) 3. History of diabetes mellitus (Z86.39) 4. History of esophageal reflux (Z87.19) 5. History of hyperglycemia (Z86.39) 6. History of hyperlipidemia (Z86.39) 7. History of hypertension (Z86.79) 8. History of hypothyroidism (Z86.39) 9. History of Low bone mass (M85.80) 10. History of Vitamin D insufficiency (E55.9)  Surgical History Problems  1. History of Colonoscopy (Fiberoptic) 2. History of Gallbladder Surgery 3. History of Hysterectomy 4. History of Upper Gastrointestinal Endoscopy (Therapeutic)  Current Meds 1.  Atorvastatin Calcium 40 MG Oral Tablet;  Therapy: (Recorded:03Feb2016) to Recorded 2. Hydrochlorothiazide 25 MG Oral Tablet;  Therapy: (Recorded:03Feb2016) to Recorded 3. Levothyroxine Sodium 88 MCG Oral Tablet;  Therapy: (Recorded:03Feb2016) to Recorded  Allergies Medication  1. Actonel TABS  Family History Problems  1. Family history of Death of family member : Mother, Father 2. Family history of cardiac disorder (Z82.49) : Father 3. Family history of diabetes mellitus (Z83.3) : Mother 4. Family history of kidney stones (Z84.1) : Brother 5. Family history of stroke (Z82.3) : Brother  Social History Problems  1. Denied: History of Alcohol use 2. Current every day smoker (F17.200)   0.80 ppd (ciggs) for 50 years 3. Death in the family, father   age 14 CHF 63. Death in the family, mother   age 36 broken hip 68. Divorced 6. No caffeine use 7. Retired  Engineer, site Vital Signs [Data Includes: Last 1 Day]  Recorded: 23Feb2016 09:41AM  Blood Pressure: 121 / 79 Temperature: 97.9 F Heart Rate: 82  Physical Exam Constitutional: Well nourished and well developed . No acute distress.  ENT:. The ears and nose are normal in appearance.  Neck: The appearance of the neck is normal and no neck mass is present.  Pulmonary: No respiratory distress and normal respiratory rhythm and effort.  Cardiovascular: Heart rate and rhythm are normal . No peripheral edema.  Abdomen: The abdomen is soft and nontender. No masses are palpated. No CVA tenderness. No hernias are palpable. No hepatosplenomegaly noted.  Skin: Normal skin turgor, no visible rash and no visible skin lesions.  Neuro/Psych:. Mood and affect are appropriate.    Results/Data Urine [Data Includes: Last 1 Day]  [id number]  COLOR YELLOW   APPEARANCE CLOUDY   SPECIFIC GRAVITY 1.015   pH 6.0   GLUCOSE NEG mg/dL  BILIRUBIN NEG   KETONE NEG mg/dL  BLOOD SMALL   PROTEIN NEG mg/dL  UROBILINOGEN 0.2 mg/dL  NITRITE NEG    LEUKOCYTE ESTERASE LARGE   SQUAMOUS EPITHELIAL/HPF RARE   WBC 21-50 WBC/hpf  RBC NONE SEEN RBC/hpf  BACTERIA FEW   CRYSTALS NONE SEEN   CASTS NONE SEEN   Selected Results  AU CT-STONE PROTOCOL [id number] 12:00AM Louis Meckel   Test Name Result Flag Reference  CT-STONE PROTOCOL (Report)    ** RADIOLOGY REPORT BY [facility] **   CLINICAL DATA: Gross hematuria began 3 months ago. Two episodes since.  EXAM: CT ABDOMEN AND PELVIS WITHOUT CONTRAST  TECHNIQUE: Multidetector CT imaging of the abdomen and pelvis was performed following the standard protocol without IV contrast.  COMPARISON: CT scan 01/11/2011  FINDINGS: Lower chest: The visualized lung bases are clear. The heart is not visualized.  Hepatobiliary: The unenhanced appearance of the liver is unremarkable. No focal hepatic lesions or intrahepatic biliary dilatation. The gallbladder surgically absent. No common bile duct dilatation.  Pancreas: Normal  Spleen: Normal  Adrenals/Urinary Tract: Bilateral adrenal gland adenomas are noted. There is a large staghorn type calculus filling almost the entire left renal collecting system. This was also noted on the prior CT scan. This likely accounts for the patient's hematuria. No ureteral calculi or bladder calculi.  Stomach/Bowel: The stomach, duodenum, small bowel and colon are grossly normal without oral contrast. No inflammatory changes, mass lesions or obstructive findings. The terminal ileum and appendix are normal. There are scattered colonic diverticuli but no findings for acute diverticulitis.  Vascular/Lymphatic: No mesenteric or retroperitoneal mass or adenopathy. Moderate to advanced atherosclerotic calcifications involving the aorta branch vessels. No focal aneurysm.  Reproductive: The uterus is surgically absent. Both ovaries are still present and appear normal except for small cysts.  Other: No pelvic mass or adenopathy. No free  pelvic fluid collections. The bladder appears normal. No inguinal mass or adenopathy.  Musculoskeletal: No significant bony findings.  IMPRESSION: 1. Large staghorn type left renal calculus occupying almost the entire left renal collecting system and likely accounting for the patient's hematuria. Small lower pole right renal calculus. No obstructing ureteral calculi. No bladder calculi. 2. No acute abdominal/pelvic findings, mass lesions or adenopathy. 3. Moderate to advanced atherosclerotic calcifications involving the aorta and iliac arteries.   Electronically Signed  By: Marijo Sanes M.D.  On: 05/30/2014 14:58    I have reviewed the patient's CT scan - shows a complete stagehorn calculus on the left side.  Assessment Assessed  1. Left nephrolithiasis (N20.0)  Plan Health Maintenance  1. UA With REFLEX; [Do Not Release]; Status:Complete;   Done: [id number] 09:14AM Recurrent urinary tract infection  2. Follow-up Schedule Surgery Office  Follow-up  Status: Hold For - Appointment   Requested for: [phone] 3. URINE CULTURE; Status:In Progress - Specimen/Data Collected;   Done: [id number]  Discussion/Summary I discussed the significance of a complete staghorn stone on the left. I explained to her that to leave this stone would make her susceptible to recurrent UTIs, pyelonephritis, XGP and loss of kidney function. I recommended that we perform PCNL in order to treat her stone. I suspect that this would require multi-access tracks and a staged operation. I counciled her on the risks and benefits inlcuding damage to the spleen, colon, renal hemorrhage, infection, and need to perform multiple operations. The encouraged  the patient to stop smoking - which she adamently declined. we will get her scheduled as soon as possible.

## 2014-08-22 NOTE — Anesthesia Procedure Notes (Signed)
Procedure Name: LMA Insertion Date/Time: 08/22/2014 9:31 AM Performed by: Deliah Boston Pre-anesthesia Checklist: Patient identified, Emergency Drugs available, Suction available and Patient being monitored Patient Re-evaluated:Patient Re-evaluated prior to inductionOxygen Delivery Method: Circle system utilized Preoxygenation: Pre-oxygenation with 100% oxygen Intubation Type: IV induction Ventilation: Mask ventilation without difficulty LMA: LMA inserted LMA Size: 4.0 Number of attempts: 1 Placement Confirmation: positive ETCO2 and breath sounds checked- equal and bilateral Tube secured with: Tape Dental Injury: Teeth and Oropharynx as per pre-operative assessment

## 2014-08-22 NOTE — Transfer of Care (Signed)
Immediate Anesthesia Transfer of Care Note  Patient: Erika Mason  Procedure(s) Performed: Procedure(s): LEFT URETEROSCOPY, LASER LITHOTRIPSY AND STENT EXCHANGE (Left) HOLMIUM LASER APPLICATION (N/A)  Patient Location: PACU  Anesthesia Type:General  Level of Consciousness: Patient easily awoken, sedated, comfortable, cooperative, following commands, responds to stimulation.   Airway & Oxygen Therapy: Patient spontaneously breathing, ventilating well, oxygen via simple oxygen mask.  Post-op Assessment: Report given to PACU RN, vital signs reviewed and stable, moving all extremities.   Post vital signs: Reviewed and stable.  Complications: No apparent anesthesia complications

## 2014-08-22 NOTE — Anesthesia Postprocedure Evaluation (Signed)
Anesthesia Post Note  Patient: Erika Mason  Procedure(s) Performed: Procedure(s) (LRB): LEFT URETEROSCOPY, LASER LITHOTRIPSY AND STENT EXCHANGE (Left) HOLMIUM LASER APPLICATION (N/A)  Anesthesia type: General  Patient location: PACU  Post pain: Pain level controlled  Post assessment: Post-op Vital signs reviewed  Last Vitals: BP 98/46 mmHg  Pulse 80  Temp(Src) 36.6 C (Oral)  Resp 12  Ht 5\' 3"  (1.6 m)  Wt 144 lb 2 oz (65.375 kg)  BMI 25.54 kg/m2  SpO2 93%  Post vital signs: Reviewed  Level of consciousness: sedated  Complications: No apparent anesthesia complications

## 2014-08-22 NOTE — Discharge Instructions (Signed)
DISCHARGE INSTRUCTIONS FOR KIDNEY STONE/URETERAL STENT   MEDICATIONS:  1.  Resume all your other meds from home - except do not take any extra narcotic pain meds that you may have at home.  2. Trospium is to prevent bladder spasms and help reduce urinary frequency. 3. Pyridium is to help with the burning/stinging when you urinate.   ACTIVITY:  1. No strenuous activity x 1week  2. No driving while on narcotic pain medications  3. Drink plenty of water  4. Continue to walk at home - you can still get blood clots when you are at home, so keep active, but don't over do it.  5. May return to work/school tomorrow or when you feel ready   BATHING:  1. You can shower and we recommend daily showers  2. You have a string coming from your urethra: The stent string is attached to your ureteral stent. Do not pull on this.   SIGNS/SYMPTOMS TO CALL:  Please call us if you have a fever greater than 101.5, uncontrolled nausea/vomiting, uncontrolled pain, dizziness, unable to urinate, bloody urine, chest pain, shortness of breath, leg swelling, leg pain, redness around wound, drainage from wound, or any other concerns or questions.   You can reach [place] at [phone].   FOLLOW-UP:  1. You have an appointment for stent removal in 1 week.     General Anesthesia, Care After Refer to this sheet in the next few weeks. These instructions provide you with information on caring for yourself after your procedure. Your health care provider may also give you more specific instructions. Your treatment has been planned according to current medical practices, but problems sometimes occur. Call your health care provider if you have any problems or questions after your procedure. WHAT TO EXPECT AFTER THE PROCEDURE After the procedure, it is typical to experience:  Sleepiness.  Nausea and vomiting. HOME CARE INSTRUCTIONS  For the first 24 hours after general anesthesia:  Have a responsible person with  you.  Do not drive a car. If you are alone, do not take public transportation.  Do not drink alcohol.  Do not take medicine that has not been prescribed by your health care provider.  Do not sign important papers or make important decisions.  You may resume a normal diet and activities as directed by your health care provider.  Change bandages (dressings) as directed.  If you have questions or problems that seem related to general anesthesia, call the hospital and ask for the anesthetist or anesthesiologist on call. SEEK MEDICAL CARE IF:  You have nausea and vomiting that continue the day after anesthesia.  You develop a rash. SEEK IMMEDIATE MEDICAL CARE IF:   You have difficulty breathing.  You have chest pain.  You have any allergic problems. Document Released: 07/04/2000 Document Revised: 04/02/2013 Document Reviewed: 10/11/2012 [facility] Patient Information 2015 [place], [place]. This information is not intended to replace advice given to you by your health care provider. Make sure you discuss any questions you have with your health care provider.

## 2014-08-22 NOTE — Interval H&P Note (Signed)
History and Physical Interval Note: PAtient presents for second stage ureterscopy from her PCNL 1 week prior for a large left sided staghorn.  She tolerated the surgery last week well.  There are no changes to her H&P.  08/22/2014 5:34 AM  Erika Mason  has presented today for surgery, with the diagnosis of left staghorn calculus  The various methods of treatment have been discussed with the patient and family. After consideration of risks, benefits and other options for treatment, the patient has consented to  Procedure(s): LEFT URETEROSCOPY, LASER LITHOTRIPSY AND STENT EXCHANGE (Left) HOLMIUM LASER APPLICATION (N/A) as a surgical intervention .  The patient's history has been reviewed, patient examined, no change in status, stable for surgery.  I have reviewed the patient's chart and labs.  Questions were answered to the patient's satisfaction.     Louis Meckel W

## 2014-08-22 NOTE — Anesthesia Preprocedure Evaluation (Addendum)
Anesthesia Evaluation  Patient identified by MRN, date of birth, ID band Patient awake    History of Anesthesia Complications (+) PONV and history of anesthetic complications  Airway Mallampati: II  TM Distance: >3 FB Neck ROM: Full    Dental   Pulmonary COPDCurrent Smoker,  breath sounds clear to auscultation        Cardiovascular hypertension, + Peripheral Vascular Disease Rhythm:Regular Rate:Normal     Neuro/Psych    GI/Hepatic Neg liver ROS, GERD-  ,  Endo/Other  diabetesHypothyroidism   Renal/GU Renal disease     Musculoskeletal   Abdominal   Peds  Hematology   Anesthesia Other Findings   Reproductive/Obstetrics                             Anesthesia Physical  Anesthesia Plan  ASA: III  Anesthesia Plan: General   Post-op Pain Management:    Induction: Intravenous  Airway Management Planned: LMA  Additional Equipment:   Intra-op Plan:   Post-operative Plan: Extubation in OR  Informed Consent: I have reviewed the patients History and Physical, chart, labs and discussed the procedure including the risks, benefits and alternatives for the proposed anesthesia with the patient or authorized representative who has indicated his/her understanding and acceptance.   Dental advisory given  Plan Discussed with: CRNA  Anesthesia Plan Comments:        Anesthesia Quick Evaluation

## 2014-08-22 NOTE — Op Note (Signed)
Preoperative diagnosis: left renal calculus  Postoperative diagnosis: left renal calculus  Procedure:  1. Cystoscopy 2. left ureteroscopy and stone removal 3. Ureteroscopic laser lithotripsy 4. left 40F x 24 ureteral stent exchange 5. left retrograde pyelography with interpretation  Surgeon: Ardis Hughs, MD  Anesthesia: General  Complications: None  Intraoperative findings: left retrograde pyelography demonstrated no filling defect within the left ureter but several filling defects within the renal pelvis consistent with the patient's known calculus without other abnormalities.  EBL: Minimal  Specimens: 1. left renal calculus  Disposition of specimens: [facility] for stone analysis  Indication: Erika Mason is a 69 y.o.   patient with urolithiasis. She is status post left percutaneous nephrolithotomy one week prior. She presents today for second stage follow-up ureteroscopy. After reviewing the management options for treatment, the patient elected to proceed with the above surgical procedure(s). We have discussed the potential benefits and risks of the procedure, side effects of the proposed treatment, the likelihood of the patient achieving the goals of the procedure, and any potential problems that might occur during the procedure or recuperation. Informed consent has been obtained.  Description of procedure:  The patient was taken to the operating room and general anesthesia was induced.  The patient was placed in the dorsal lithotomy position, prepped and draped in the usual sterile fashion, and preoperative antibiotics were administered. A preoperative time-out was performed.   Cystourethroscopy was performed. A 360 creases Evaluation was performed with no significant bladder mucosal abnormalities noted. The ureteral orifices were orthotopic. Her tissue was then turned to the left ureter and the stent emanating from the left UO was grasped and  pulled to the urethral meatus. A 0.38 sensor wire was then advanced up through the stent and the stent was removed. A dual-lumen catheter was then advanced over the wire and retrograde pyelogram was performed with the above findings. A second 0.38 sensor wire was then advanced up through the dual-lumen catheter and the catheter was then removed over the wire. A 12/14 French 35 cm ureteral access sheath was then advanced over the second wire and into the proximal ureter. The inner torsion of the sheath and the wire were then removed. A flexible ureteroscope was then advanced through the access sheath and into the left renal pelvis. There were several large fragments in the upper pole which were retrieved using engage basket. It was also a calyx in the interpolar region which was filled with stone and required laser fragmentation using the 2 are micron fiber with settings of 0.8 and 6. In addition, there was a large stone in the lower pole that was very difficult to get up but ultimately I was able to get the laser onto the stone fragmented enough to pull it into a more workable location and fragment the remaining portions of the stone to these also could be removed with the engage basket. After all the large stone fragments had been removed using the engage basket I attempted to use an escape basket to remove all the small fragments. Ultimately, there were numerous small fragments left behind as these were not amenable to stent basket extraction. The patient did not have any large fragments notable remaining after all the stone fragments had been removed. I then backed the ureteral sheath out of the ureter under visual guidance ensuring that there were no residual stone fragments within the ureter. I then advanced a 24 cm 6 French double-J ureteral stent over the safety wire and into  the left renal pelvis using fluoroscopic guidance. I then advanced the distal end of the stent to the urethral meatus and removed the  safety wire pushing the stent into the patient's bladder. The bladder was then drained. A B and O suppository was then placed in the patient's rectum at the end of the case. The stent was noted to be in a good position by a completion fluoroscopic image demonstrating a good curl in the bladder as well as in the left renal pelvis. The patient was subsequent return to the PACU in excellent condition.   Disposition: The patient will be scheduled for stent removal in 5-7 days in our clinic.

## 2014-08-25 ENCOUNTER — Encounter (HOSPITAL_COMMUNITY): Payer: Self-pay | Admitting: Urology

## 2014-08-29 DIAGNOSIS — N200 Calculus of kidney: Secondary | ICD-10-CM | POA: Diagnosis not present

## 2014-10-01 ENCOUNTER — Telehealth: Payer: Self-pay

## 2014-10-01 MED ORDER — ATORVASTATIN CALCIUM 40 MG PO TABS: ORAL_TABLET | ORAL | Status: DC

## 2014-10-01 NOTE — Telephone Encounter (Signed)
Rx sent in

## 2014-10-01 NOTE — Telephone Encounter (Signed)
Erika Mason   Patient is requesting a 90 day supply of atorvastatin (LIPITOR) 40 MG tablet   [facility] Mail In

## 2014-10-02 ENCOUNTER — Encounter: Payer: Self-pay | Admitting: Internal Medicine

## 2014-10-07 ENCOUNTER — Ambulatory Visit (INDEPENDENT_AMBULATORY_CARE_PROVIDER_SITE_OTHER): Payer: Commercial Managed Care - HMO | Admitting: Physician Assistant

## 2014-10-07 ENCOUNTER — Encounter: Payer: Self-pay | Admitting: Physician Assistant

## 2014-10-07 VITALS — BP 132/80 | HR 92 | Temp 98.5°F | Resp 16 | Ht 63.5 in | Wt 143.0 lb

## 2014-10-07 DIAGNOSIS — E559 Vitamin D deficiency, unspecified: Secondary | ICD-10-CM

## 2014-10-07 DIAGNOSIS — I10 Essential (primary) hypertension: Secondary | ICD-10-CM

## 2014-10-07 DIAGNOSIS — E785 Hyperlipidemia, unspecified: Secondary | ICD-10-CM

## 2014-10-07 DIAGNOSIS — R809 Proteinuria, unspecified: Secondary | ICD-10-CM | POA: Diagnosis not present

## 2014-10-07 DIAGNOSIS — E039 Hypothyroidism, unspecified: Secondary | ICD-10-CM | POA: Diagnosis not present

## 2014-10-07 DIAGNOSIS — R739 Hyperglycemia, unspecified: Secondary | ICD-10-CM

## 2014-10-07 DIAGNOSIS — R7309 Other abnormal glucose: Secondary | ICD-10-CM | POA: Diagnosis not present

## 2014-10-07 LAB — COMPREHENSIVE METABOLIC PANEL
ALT: 12 U/L (ref 0–35)
AST: 14 U/L (ref 0–37)
Albumin: 4 g/dL (ref 3.5–5.2)
Alkaline Phosphatase: 91 U/L (ref 39–117)
BUN: 15 mg/dL (ref 6–23)
CALCIUM: 9.4 mg/dL (ref 8.4–10.5)
CO2: 26 mEq/L (ref 19–32)
Chloride: 102 mEq/L (ref 96–112)
Creat: 0.97 mg/dL (ref 0.50–1.10)
GLUCOSE: 123 mg/dL — AB (ref 70–99)
POTASSIUM: 4.1 meq/L (ref 3.5–5.3)
Sodium: 143 mEq/L (ref 135–145)
Total Bilirubin: 0.7 mg/dL (ref 0.2–1.2)
Total Protein: 6.7 g/dL (ref 6.0–8.3)

## 2014-10-07 LAB — LIPID PANEL
Cholesterol: 163 mg/dL (ref 0–200)
HDL: 52 mg/dL (ref >=46)
LDL Cholesterol: 82 mg/dL (ref 0–99)
Total CHOL/HDL Ratio: 3.1 Ratio
Triglycerides: 146 mg/dL (ref <150)
VLDL: 29 mg/dL (ref 0–40)

## 2014-10-07 LAB — GLUCOSE, POCT (MANUAL RESULT ENTRY): POC GLUCOSE: 133 mg/dL — AB (ref 70–99)

## 2014-10-07 LAB — POCT GLYCOSYLATED HEMOGLOBIN (HGB A1C): HEMOGLOBIN A1C: 5.9

## 2014-10-07 NOTE — Addendum Note (Signed)
Addended by: Fara Chute on: 10/07/2014 01:57 PM   Modules accepted: Level of Service

## 2014-10-07 NOTE — Progress Notes (Signed)
Patient ID: Erika Mason, female    DOB: Jul 06, 1945, 69 y.o.   MRN: [id number]  PCP: Finlee Concepcion, PA-C  Subjective:   Chief Complaint  Patient presents with  . Hyperlipidemia    follow up    HPI Presents for evaluation of hyperglycemia, HTN, hyperlipidemia and hypothyroidism.  Since her last visit, she's had lithotripsy to treat a staghorn calculus. She's doing well and has follow-up with urology next month with CT and then office visit.  Frequency of home glucose monitoring: Sees a dentist Q6 months, eye specialist annually. Checks feet daily. Is not current with influenza vaccine. Will get one this upcoming season. Is current with 23-valent pneumococcal vaccine. Declines Prevnar-13   Review of Systems  Constitutional: Negative for activity change, appetite change, fatigue and unexpected weight change.  HENT: Negative for congestion, dental problem, ear pain, hearing loss, mouth sores, postnasal drip, rhinorrhea, sneezing, sore throat, tinnitus and trouble swallowing.   Eyes: Negative for photophobia, pain, redness and visual disturbance.  Respiratory: Negative for cough, chest tightness and shortness of breath.   Cardiovascular: Negative for chest pain, palpitations and leg swelling.  Gastrointestinal: Negative for nausea, vomiting, abdominal pain, diarrhea, constipation and blood in stool.  Genitourinary: Negative for dysuria, urgency, frequency and hematuria.  Musculoskeletal: Negative for myalgias, arthralgias, gait problem and neck stiffness.  Skin: Negative for rash.  Neurological: Negative for dizziness, speech difficulty, weakness, light-headedness, numbness and headaches.  Hematological: Negative for adenopathy.  Psychiatric/Behavioral: Negative for confusion and sleep disturbance. The patient is not nervous/anxious.        Patient Active Problem List   Diagnosis Date Noted  . Smoker 07/08/2014  . Seasonal allergies 07/08/2014  . Atherosclerosis of  aorta 06/05/2014  . Atherosclerosis of artery 06/05/2014  . Hemorrhagic cystitis 05/17/2014  . HTN (hypertension) 08/14/2012  . Hyperlipidemia 08/14/2012  . Hypothyroidism 08/14/2012  . GERD (gastroesophageal reflux disease) 08/14/2012  . Lingular mass 08/14/2012  . Hyperglycemia 08/14/2012  . Edema   . Low bone mass   . History of colon polyps   . Vitamin D insufficiency   . Nephrolithiasis   . Diverticulosis      Prior to Admission medications   Medication Sig Start Date End Date Taking? Authorizing Provider  albuterol (PROVENTIL HFA;VENTOLIN HFA) 108 (90 BASE) MCG/ACT inhaler Inhale 2 puffs into the lungs every 4 (four) hours as needed for wheezing or shortness of breath. 07/08/14  Yes Adelaide Pfefferkorn, PA-C  atorvastatin (LIPITOR) 40 MG tablet TAKE 1 TABLET DAILY (NEED CHECK UP FOR ADDITIONAL REFILLS) 10/01/14  Yes Everleigh Colclasure, PA-C  hydrochlorothiazide (HYDRODIURIL) 25 MG tablet Take 1 tablet (25 mg total) by mouth daily. PATIENT NEEDS CHECK UP FOR ADDITIONAL REFILLS 05/29/14  Yes Mancel Bale, PA-C  levothyroxine (SYNTHROID, LEVOTHROID) 88 MCG tablet TAKE 1 TABLET BY MOUTH DAILY. 01/28/14  Yes Karis Emig, PA-C  pantoprazole (PROTONIX) 40 MG tablet TAKE 1 TABLET BY MOUTH EVERY DAY 07/08/14  Yes Mykel Mohl, PA-C  sodium chloride (OCEAN) 0.65 % SOLN nasal spray Place 1 spray into both nostrils as needed for congestion.   Yes Historical Provider, MD     Allergies  Allergen Reactions  . Actonel [Risedronate Sodium] Other (See Comments)    Chest Pain/Severe GERD       Objective:  Physical Exam  Constitutional: She is oriented to person, place, and time. She appears well-developed and well-nourished. No distress.  BP 132/80 mmHg  Pulse 92  Temp(Src) 98.5 F (36.9 C) (Oral)  Resp 16  Ht  5' 3.5" (1.613 m)  Wt 143 lb (64.864 kg)  BMI 24.93 kg/m2  SpO2 95%   Eyes: Conjunctivae are normal. No scleral icterus.  Neck: No thyromegaly present.  Cardiovascular: Normal  rate, regular rhythm, normal heart sounds and intact distal pulses.   Pulmonary/Chest: Effort normal and breath sounds normal.  Lymphadenopathy:    She has no cervical adenopathy.  Neurological: She is alert and oriented to person, place, and time.  Skin: Skin is warm and dry.  Psychiatric: She has a normal mood and affect. Her behavior is normal.   Results for orders placed or performed in visit on 10/07/14  POCT glycosylated hemoglobin (Hb A1C)  Result Value Ref Range   Hemoglobin A1C 5.9   POCT glucose (manual entry)  Result Value Ref Range   POC Glucose 133 (A) 70 - 99 mg/dl           Assessment & Plan:   1. Hyperglycemia Well controlled. No need for medications. Declines Prevnar-13.  - Comprehensive metabolic panel - POCT glycosylated hemoglobin (Hb A1C) - POCT glucose (manual entry)  2. Hyperlipidemia Await lab results. - Lipid panel  3. Essential hypertension Controlled.  4. Hypothyroidism, unspecified hypothyroidism type Normal TSH in 06/2014. Recheck in 06/2015.  5. Vitamin D insufficiency Not on supplementation. Await result. - Vit D  25 hydroxy (rtn osteoporosis monitoring)  6. Microalbuminuria Needs ACEI. Await result. - Microalbumin, urine   Fara Chute, PA-C Physician Assistant-Certified [facility] .

## 2014-10-07 NOTE — Patient Instructions (Signed)
I will contact you with your lab results as soon as they are available.   If you have not heard from me in 2 weeks, please contact me.  The fastest way to get your results is to register for My Chart (see the instructions on the last page of this printout).   

## 2014-10-08 LAB — MICROALBUMIN, URINE: Microalb, Ur: 2.3 mg/dL — ABNORMAL HIGH (ref <2.0)

## 2014-10-08 LAB — VITAMIN D 25 HYDROXY (VIT D DEFICIENCY, FRACTURES): VIT D 25 HYDROXY: 32 ng/mL (ref 30–100)

## 2014-10-10 DIAGNOSIS — N390 Urinary tract infection, site not specified: Secondary | ICD-10-CM | POA: Diagnosis not present

## 2014-10-10 DIAGNOSIS — N200 Calculus of kidney: Secondary | ICD-10-CM | POA: Diagnosis not present

## 2014-10-14 DIAGNOSIS — N200 Calculus of kidney: Secondary | ICD-10-CM | POA: Diagnosis not present

## 2014-12-05 ENCOUNTER — Other Ambulatory Visit: Payer: Self-pay | Admitting: Physician Assistant

## 2015-02-03 ENCOUNTER — Encounter: Payer: Self-pay | Admitting: Physician Assistant

## 2015-02-03 ENCOUNTER — Ambulatory Visit (INDEPENDENT_AMBULATORY_CARE_PROVIDER_SITE_OTHER): Payer: Commercial Managed Care - HMO | Admitting: Physician Assistant

## 2015-02-03 VITALS — BP 128/70 | HR 80 | Temp 98.6°F | Resp 16 | Ht 62.75 in | Wt 152.8 lb

## 2015-02-03 DIAGNOSIS — Z1231 Encounter for screening mammogram for malignant neoplasm of breast: Secondary | ICD-10-CM | POA: Diagnosis not present

## 2015-02-03 DIAGNOSIS — E559 Vitamin D deficiency, unspecified: Secondary | ICD-10-CM | POA: Diagnosis not present

## 2015-02-03 DIAGNOSIS — Z720 Tobacco use: Secondary | ICD-10-CM | POA: Diagnosis not present

## 2015-02-03 DIAGNOSIS — E039 Hypothyroidism, unspecified: Secondary | ICD-10-CM | POA: Diagnosis not present

## 2015-02-03 DIAGNOSIS — E785 Hyperlipidemia, unspecified: Secondary | ICD-10-CM

## 2015-02-03 DIAGNOSIS — F17200 Nicotine dependence, unspecified, uncomplicated: Secondary | ICD-10-CM

## 2015-02-03 DIAGNOSIS — I10 Essential (primary) hypertension: Secondary | ICD-10-CM | POA: Diagnosis not present

## 2015-02-03 DIAGNOSIS — R739 Hyperglycemia, unspecified: Secondary | ICD-10-CM

## 2015-02-03 DIAGNOSIS — Z23 Encounter for immunization: Secondary | ICD-10-CM

## 2015-02-03 LAB — CBC WITH DIFFERENTIAL/PLATELET
Basophils Absolute: 0.1 10*3/uL (ref 0.0–0.1)
Basophils Relative: 1 % (ref 0–1)
Eosinophils Absolute: 0.2 10*3/uL (ref 0.0–0.7)
Eosinophils Relative: 2 % (ref 0–5)
HCT: 41.5 % (ref 36.0–46.0)
Hemoglobin: 14 g/dL (ref 12.0–15.0)
LYMPHS ABS: 1.9 10*3/uL (ref 0.7–4.0)
Lymphocytes Relative: 22 % (ref 12–46)
MCH: 28.7 pg (ref 26.0–34.0)
MCHC: 33.7 g/dL (ref 30.0–36.0)
MCV: 85 fL (ref 78.0–100.0)
MONOS PCT: 7 % (ref 3–12)
MPV: 10.6 fL (ref 8.6–12.4)
Monocytes Absolute: 0.6 10*3/uL (ref 0.1–1.0)
NEUTROS ABS: 5.8 10*3/uL (ref 1.7–7.7)
Neutrophils Relative %: 68 % (ref 43–77)
Platelets: 242 10*3/uL (ref 150–400)
RBC: 4.88 MIL/uL (ref 3.87–5.11)
RDW: 16.2 % — ABNORMAL HIGH (ref 11.5–15.5)
WBC: 8.6 10*3/uL (ref 4.0–10.5)

## 2015-02-03 LAB — COMPREHENSIVE METABOLIC PANEL
ALBUMIN: 4.2 g/dL (ref 3.6–5.1)
ALT: 17 U/L (ref 6–29)
AST: 14 U/L (ref 10–35)
Alkaline Phosphatase: 103 U/L (ref 33–130)
BUN: 20 mg/dL (ref 7–25)
CALCIUM: 9.6 mg/dL (ref 8.6–10.4)
CO2: 25 mmol/L (ref 20–31)
Chloride: 104 mmol/L (ref 98–110)
Creat: 1.08 mg/dL — ABNORMAL HIGH (ref 0.50–0.99)
Glucose, Bld: 127 mg/dL — ABNORMAL HIGH (ref 65–99)
POTASSIUM: 4.2 mmol/L (ref 3.5–5.3)
Sodium: 142 mmol/L (ref 135–146)
TOTAL PROTEIN: 6.7 g/dL (ref 6.1–8.1)
Total Bilirubin: 0.6 mg/dL (ref 0.2–1.2)

## 2015-02-03 LAB — LIPID PANEL
CHOLESTEROL: 161 mg/dL (ref 125–200)
HDL: 50 mg/dL (ref >=46)
LDL CALC: 81 mg/dL (ref <130)
TRIGLYCERIDES: 148 mg/dL (ref <150)
Total CHOL/HDL Ratio: 3.2 Ratio (ref <=5.0)
VLDL: 30 mg/dL (ref <30)

## 2015-02-03 LAB — POCT GLYCOSYLATED HEMOGLOBIN (HGB A1C): Hemoglobin A1C: 6.2

## 2015-02-03 LAB — TSH: TSH: 1.854 u[IU]/mL (ref 0.350–4.500)

## 2015-02-03 LAB — HEMOGLOBIN A1C: HEMOGLOBIN A1C: 6.2 % — AB (ref 4.0–6.0)

## 2015-02-03 LAB — GLUCOSE, POCT (MANUAL RESULT ENTRY): POC Glucose: 139 mg/dl — AB (ref 70–99)

## 2015-02-03 NOTE — Patient Instructions (Signed)
I will contact you with your lab results as soon as they are available.   If you have not heard from me in 2 weeks, please contact me.  The fastest way to get your results is to register for My Chart (see the instructions on the last page of this printout).   

## 2015-02-03 NOTE — Progress Notes (Signed)
Patient ID: Erika Mason, female    DOB: 06-18-1945, 69 y.o.   MRN: [id number]  PCP: Jaella Weinert, PA-C  Subjective:   Chief Complaint  Patient presents with  . Follow-up    4 mos  . Hypertension  . Hyperlipidemia  . Hyperglycemia    HPI Presents for evaluation of hyperlipidemia, HTN and hyperglycemia. These have all been well-controlled and she tolerates medications well without adverse effects.  She feels well. No complaints or concerns. Notes that since surgery to remove a large staghorn renal calculus she has had larger urine volumes than before. No increase in urgency or frequency.  Frequency of home glucose monitoring: Has not seen a dentist in the past6 months, needs to find a new one in her network. eye specialist annually, within the past year. Checks feet daily. Is not current with influenza vaccine. Is not current with pneumococcal vaccine (needs Prevnar-13).   Review of Systems  Constitutional: Negative for activity change, appetite change, fatigue and unexpected weight change.  HENT: Negative for congestion, dental problem, ear pain, hearing loss, mouth sores, postnasal drip, rhinorrhea, sneezing, sore throat, tinnitus and trouble swallowing.   Eyes: Negative for photophobia, pain, redness and visual disturbance.  Respiratory: Negative for cough, chest tightness and shortness of breath.   Cardiovascular: Negative for chest pain, palpitations and leg swelling.  Gastrointestinal: Negative for nausea, vomiting, abdominal pain, diarrhea, constipation and blood in stool.  Genitourinary: Negative for dysuria, urgency, frequency and hematuria.  Musculoskeletal: Negative for myalgias, arthralgias, gait problem and neck stiffness.  Skin: Negative for rash.  Neurological: Negative for dizziness, speech difficulty, weakness, light-headedness, numbness and headaches.  Hematological: Negative for adenopathy.  Psychiatric/Behavioral: Negative for confusion and sleep  disturbance. The patient is not nervous/anxious.        Patient Active Problem List   Diagnosis Date Noted  . Smoker 07/08/2014  . Seasonal allergies 07/08/2014  . Atherosclerosis of aorta ([place]) 06/05/2014  . Atherosclerosis of artery 06/05/2014  . Hemorrhagic cystitis 05/17/2014  . HTN (hypertension) 08/14/2012  . Hyperlipidemia 08/14/2012  . Hypothyroidism 08/14/2012  . GERD (gastroesophageal reflux disease) 08/14/2012  . Lingular mass 08/14/2012  . Hyperglycemia 08/14/2012  . Edema   . Low bone mass   . History of colon polyps   . Vitamin D insufficiency   . Nephrolithiasis   . Diverticulosis      Prior to Admission medications   Medication Sig Start Date End Date Taking? Authorizing Provider  albuterol (PROVENTIL HFA;VENTOLIN HFA) 108 (90 BASE) MCG/ACT inhaler Inhale 2 puffs into the lungs every 4 (four) hours as needed for wheezing or shortness of breath. 07/08/14  Yes Nefi Musich, PA-C  atorvastatin (LIPITOR) 40 MG tablet TAKE 1 TABLET DAILY (NEED CHECK UP FOR ADDITIONAL REFILLS) 10/01/14  Yes Shermar Friedland, PA-C  hydrochlorothiazide (HYDRODIURIL) 25 MG tablet Take 1 tablet (25 mg total) by mouth daily. PATIENT NEEDS CHECK UP FOR ADDITIONAL REFILLS 05/29/14  Yes Mancel Bale, PA-C  levothyroxine (SYNTHROID, LEVOTHROID) 88 MCG tablet TAKE 1 TABLET DAILY 12/08/14  Yes Zayna Toste, PA-C  pantoprazole (PROTONIX) 40 MG tablet TAKE 1 TABLET BY MOUTH EVERY DAY 07/08/14  Yes Classie Weng, PA-C  sodium chloride (OCEAN) 0.65 % SOLN nasal spray Place 1 spray into both nostrils as needed for congestion.   Yes Historical Provider, MD     Allergies  Allergen Reactions  . Actonel [Risedronate Sodium] Other (See Comments)    Chest Pain/Severe GERD       Objective:  Physical Exam  Constitutional: She is oriented to person, place, and time. She appears well-developed and well-nourished. No distress.  BP 128/70 mmHg  Pulse 80  Temp(Src) 98.6 F (37 C) (Oral)  Resp 16  Ht  5' 2.75" (1.594 m)  Wt 152 lb 12.8 oz (69.31 kg)  BMI 27.28 kg/m2  SpO2 96%   Eyes: Conjunctivae are normal. No scleral icterus.  Neck: No thyromegaly present.  Cardiovascular: Normal rate, regular rhythm, normal heart sounds and intact distal pulses.   Pulmonary/Chest: Effort normal and breath sounds normal.  Lymphadenopathy:    She has no cervical adenopathy.  Neurological: She is alert and oriented to person, place, and time.  Skin: Skin is warm and dry.  Psychiatric: She has a normal mood and affect. Her behavior is normal.           Assessment & Plan:   1. Essential hypertension Controlled. Continue current treatment. - CBC with Differential/Platelet - Comprehensive metabolic panel  2. Hyperlipidemia Await lab results. - Lipid panel  3. Hypothyroidism, unspecified hypothyroidism type Await lab results, adjust levothyroxine dose if indicated. - TSH  4. Hyperglycemia Await labs. Add metformin if indicated. - POCT glucose (manual entry) - POCT glycosylated hemoglobin (Hb A1C)  5. Vitamin D insufficiency Await lab results.  - Vit D  25 hydroxy (rtn osteoporosis monitoring)  6. Smoker Not ready yet, but keeps working on cutting back.  7. Need for pneumococcal vaccination - Pneumococcal conjugate vaccine 13-valent IM  8. Need for influenza vaccination - Flu Vaccine QUAD 36+ mos IM  9. Visit for screening mammogram - MM Digital Screening; Future  Return in about 6 months (around 08/04/2015).   Fara Chute, PA-C Physician Assistant-Certified [facility]

## 2015-02-04 LAB — VITAMIN D 25 HYDROXY (VIT D DEFICIENCY, FRACTURES): Vit D, 25-Hydroxy: 34 ng/mL (ref 30–100)

## 2015-02-12 ENCOUNTER — Other Ambulatory Visit: Payer: Self-pay | Admitting: Physician Assistant

## 2015-03-21 ENCOUNTER — Other Ambulatory Visit: Payer: Self-pay | Admitting: Internal Medicine

## 2015-03-23 ENCOUNTER — Ambulatory Visit
Admission: RE | Admit: 2015-03-23 | Discharge: 2015-03-23 | Disposition: A | Discharge status: Home / Self Care | Payer: Commercial Managed Care - HMO | Source: Ambulatory Visit | Attending: Physician Assistant | Admitting: Physician Assistant

## 2015-03-23 DIAGNOSIS — Z1231 Encounter for screening mammogram for malignant neoplasm of breast: Secondary | ICD-10-CM

## 2015-04-09 NOTE — Progress Notes (Signed)
This encounter was created in error - please disregard.

## 2015-06-08 ENCOUNTER — Other Ambulatory Visit: Payer: Self-pay | Admitting: Physician Assistant

## 2015-06-30 ENCOUNTER — Other Ambulatory Visit: Payer: Self-pay | Admitting: Physician Assistant

## 2015-07-13 ENCOUNTER — Other Ambulatory Visit: Payer: Self-pay | Admitting: Physician Assistant

## 2015-07-22 ENCOUNTER — Telehealth: Payer: Self-pay

## 2015-07-22 NOTE — Telephone Encounter (Signed)
Pt needs Erika Mason to contact [employer] to change her levothyroxine (SYNTHROID, LEVOTHROID) 88 MCG tablet from a 30 day supply to a 90 day supply because her insurance is charging her for the 30.  Please advise    [phone]

## 2015-07-24 MED ORDER — LEVOTHYROXINE SODIUM 88 MCG PO TABS: 88.0000 ug | ORAL_TABLET | Freq: Every day | ORAL | Status: DC

## 2015-07-24 NOTE — Telephone Encounter (Signed)
Meds ordered this encounter  Medications  . levothyroxine (SYNTHROID, LEVOTHROID) 88 MCG tablet    Sig: Take 1 tablet (88 mcg total) by mouth daily.    Dispense:  90 tablet    Refill:  3    Order Specific Question:  Supervising Provider    Answer:  Tami Lin P [id number]    Please remind patient that it's time for follow-up with me.

## 2015-07-24 NOTE — Telephone Encounter (Signed)
Can we authorize a 90 days? She is supposed to follow up around  08/04/2015.

## 2015-08-03 ENCOUNTER — Telehealth: Payer: Self-pay | Admitting: Physician Assistant

## 2015-08-03 ENCOUNTER — Ambulatory Visit (INDEPENDENT_AMBULATORY_CARE_PROVIDER_SITE_OTHER): Payer: Commercial Managed Care - HMO | Admitting: Physician Assistant

## 2015-08-03 VITALS — BP 124/80 | HR 81 | Temp 97.8°F | Resp 18 | Ht 62.75 in | Wt 161.4 lb

## 2015-08-03 DIAGNOSIS — E039 Hypothyroidism, unspecified: Secondary | ICD-10-CM | POA: Diagnosis not present

## 2015-08-03 DIAGNOSIS — Z720 Tobacco use: Secondary | ICD-10-CM | POA: Diagnosis not present

## 2015-08-03 DIAGNOSIS — E119 Type 2 diabetes mellitus without complications: Secondary | ICD-10-CM | POA: Diagnosis not present

## 2015-08-03 DIAGNOSIS — E785 Hyperlipidemia, unspecified: Secondary | ICD-10-CM | POA: Diagnosis not present

## 2015-08-03 DIAGNOSIS — I700 Atherosclerosis of aorta: Secondary | ICD-10-CM

## 2015-08-03 DIAGNOSIS — I10 Essential (primary) hypertension: Secondary | ICD-10-CM | POA: Diagnosis not present

## 2015-08-03 DIAGNOSIS — F17200 Nicotine dependence, unspecified, uncomplicated: Secondary | ICD-10-CM

## 2015-08-03 LAB — COMPREHENSIVE METABOLIC PANEL
ALBUMIN: 4.5 g/dL (ref 3.6–5.1)
ALT: 18 U/L (ref 6–29)
AST: 15 U/L (ref 10–35)
Alkaline Phosphatase: 103 U/L (ref 33–130)
BILIRUBIN TOTAL: 0.7 mg/dL (ref 0.2–1.2)
BUN: 16 mg/dL (ref 7–25)
CALCIUM: 9.7 mg/dL (ref 8.6–10.4)
CHLORIDE: 102 mmol/L (ref 98–110)
CO2: 29 mmol/L (ref 20–31)
Creat: 0.96 mg/dL — ABNORMAL HIGH (ref 0.60–0.93)
GLUCOSE: 135 mg/dL — AB (ref 65–99)
Potassium: 4.7 mmol/L (ref 3.5–5.3)
Sodium: 139 mmol/L (ref 135–146)
Total Protein: 6.9 g/dL (ref 6.1–8.1)

## 2015-08-03 LAB — POCT CBC
GRANULOCYTE PERCENT: 76 % (ref 37–80)
HEMATOCRIT: 43.2 % (ref 37.7–47.9)
Hemoglobin: 15.7 g/dL (ref 12.2–16.2)
Lymph, poc: 1.8 (ref 0.6–3.4)
MCH, POC: 31.2 pg (ref 27–31.2)
MCHC: 36.3 g/dL — AB (ref 31.8–35.4)
MCV: 86 fL (ref 80–97)
MID (CBC): 0.6 (ref 0–0.9)
MPV: 8.2 fL (ref 0–99.8)
POC GRANULOCYTE: 7.5 — AB (ref 2–6.9)
POC LYMPH %: 18 % (ref 10–50)
POC MID %: 6 %M (ref 0–12)
Platelet Count, POC: 233 10*3/uL (ref 142–424)
RBC: 5.02 M/uL (ref 4.04–5.48)
RDW, POC: 14.3 %
WBC: 9.9 10*3/uL (ref 4.6–10.2)

## 2015-08-03 LAB — LIPID PANEL
CHOL/HDL RATIO: 3.3 ratio (ref <=5.0)
CHOLESTEROL: 174 mg/dL (ref 125–200)
HDL: 53 mg/dL (ref >=46)
LDL CALC: 95 mg/dL (ref <130)
TRIGLYCERIDES: 132 mg/dL (ref <150)
VLDL: 26 mg/dL (ref <30)

## 2015-08-03 LAB — GLUCOSE, POCT (MANUAL RESULT ENTRY): POC GLUCOSE: 143 mg/dL — AB (ref 70–99)

## 2015-08-03 LAB — POCT GLYCOSYLATED HEMOGLOBIN (HGB A1C): HEMOGLOBIN A1C: 6.7

## 2015-08-03 LAB — TSH: TSH: 25.48 mIU/L — ABNORMAL HIGH

## 2015-08-03 MED ORDER — ASPIRIN 81 MG PO TABS: 81.0000 mg | ORAL_TABLET | Freq: Every day | ORAL | Status: AC

## 2015-08-03 NOTE — Progress Notes (Signed)
Patient ID: Erika Mason, female    DOB: 1945/05/23, 70 y.o.   MRN: [id number]  PCP: Linville Decarolis, PA-C  Subjective:   Chief Complaint  Patient presents with  . Follow-up    6 month follow for bloodwork    HPI Patient presents for HTN, hyperlipidemia, and T2DM follow up that is well controlled on medication. No side effects with current medication regimen.  She has no complaints at this time. She is trying to cut back on the cake and ice cream. She tries to walk occasionally but patient states "she wouldn't call it exercise".  Last Hgb A1c in 01/2015 was 6.2%. Not currently on diabetic medication. She preforms a foot exam daily.   She continues to smoke but is down to 3/4 pack a day.   She needs to schedule an eye exam. Last exam was March of 2016. Recent mammogram in December 2016 which was normal.   Review of Systems Constitutional: Negative for fever, chills and fatigue.  HENT: Negative.  Respiratory: Negative for shortness of breath.  Cardiovascular: Negative for chest pain.  Gastrointestinal: Negative for nausea, vomiting, abdominal pain and diarrhea.  Genitourinary: Negative for dysuria and hematuria.  Musculoskeletal: Positive for joint swelling and arthralgias. Negative for myalgias.  Neurological: Negative for dizziness, syncope, light-headedness and numbness.     Patient Active Problem List   Diagnosis Date Noted  . Smoker 07/08/2014  . Seasonal allergies 07/08/2014  . Atherosclerosis of aorta ([place]) 06/05/2014  . Atherosclerosis of artery 06/05/2014  . Hemorrhagic cystitis 05/17/2014  . HTN (hypertension) 08/14/2012  . Hyperlipidemia 08/14/2012  . Hypothyroidism 08/14/2012  . GERD (gastroesophageal reflux disease) 08/14/2012  . Lingular mass 08/14/2012  . Hyperglycemia 08/14/2012  . Edema   . Low bone mass   . History of colon polyps   . Vitamin D insufficiency   . Nephrolithiasis   . Diverticulosis      Prior to Admission medications     Medication Sig Start Date End Date Taking? Authorizing Provider  albuterol (PROVENTIL HFA;VENTOLIN HFA) 108 (90 BASE) MCG/ACT inhaler Inhale 2 puffs into the lungs every 4 (four) hours as needed for wheezing or shortness of breath. 07/08/14  Yes Damesha Lawler, PA-C  atorvastatin (LIPITOR) 40 MG tablet Take 1 tablet (40 mg total) by mouth daily. 07/03/15  Yes Grete Bosko, PA-C  hydrochlorothiazide (HYDRODIURIL) 25 MG tablet TAKE 1 TABLET EVERY MORNING 03/23/15  Yes Lyrica Mcclarty, PA-C  levothyroxine (SYNTHROID, LEVOTHROID) 88 MCG tablet Take 1 tablet (88 mcg total) by mouth daily. 07/24/15  Yes Jafari Mckillop, PA-C  pantoprazole (PROTONIX) 40 MG tablet TAKE 1 TABLET BY MOUTH EVERY DAY 06/08/15  Yes Wardell Honour, MD  sodium chloride (OCEAN) 0.65 % SOLN nasal spray Place 1 spray into both nostrils as needed for congestion.   Yes Historical Provider, MD     Allergies  Allergen Reactions  . Actonel [Risedronate Sodium] Other (See Comments)    Chest Pain/Severe GERD       Objective:  Physical Exam  Constitutional: She is oriented to person, place, and time. She appears well-developed and well-nourished. No distress.  BP 124/80 mmHg  Pulse 81  Temp(Src) 97.8 F (36.6 C) (Oral)  Resp 18  Ht 5' 2.75" (1.594 m)  Wt 161 lb 6.4 oz (73.211 kg)  BMI 28.81 kg/m2  SpO2 95%   Eyes: Conjunctivae are normal. No scleral icterus.  Neck: No thyromegaly present.  Cardiovascular: Normal rate, regular rhythm, normal heart sounds and intact distal pulses.   Pulmonary/Chest:  Effort normal and breath sounds normal.  Lymphadenopathy:    She has no cervical adenopathy.  Neurological: She is alert and oriented to person, place, and time.  Skin: Skin is warm and dry.  Psychiatric: She has a normal mood and affect. Her speech is normal and behavior is normal.       Results for orders placed or performed in visit on 08/03/15  POCT glucose (manual entry)  Result Value Ref Range   POC Glucose 143 (A)  70 - 99 mg/dl  POCT glycosylated hemoglobin (Hb A1C)  Result Value Ref Range   Hemoglobin A1C 6.7   POCT CBC  Result Value Ref Range   WBC 9.9 4.6 - 10.2 K/uL   Lymph, poc 1.8 0.6 - 3.4   POC LYMPH PERCENT 18.0 10 - 50 %L   MID (cbc) 0.6 0 - 0.9   POC MID % 6.0 0 - 12 %M   POC Granulocyte 7.5 (A) 2 - 6.9   Granulocyte percent 76.0 37 - 80 %G   RBC 5.02 4.04 - 5.48 M/uL   Hemoglobin 15.7 12.2 - 16.2 g/dL   HCT, POC 43.2 37.7 - 47.9 %   MCV 86.0 80 - 97 fL   MCH, POC 31.2 27 - 31.2 pg   MCHC 36.3 (A) 31.8 - 35.4 g/dL   RDW, POC 14.3 %   Platelet Count, POC 233 142 - 424 K/uL   MPV 8.2 0 - 99.8 fL       Assessment & Plan:   1. Essential hypertension Controlled. Continue current treatment. - POCT CBC - Comprehensive metabolic panel  2. Hyperlipidemia Await lab results. - Lipid panel  3. Hypothyroidism, unspecified hypothyroidism type Await lab results. - TSH  4. Smoker Encouraged smoking cessation efforts.  5. Type 2 diabetes mellitus without complication, without long-term current use of insulin ([employer]) Less-well controlled now. Recommended restarting metformin, but she wants to work harder on healthier eating and regular exercise, which had controlled her glucose prior to her surgery last year and allowed her to stop the metformin. We agree to recheck in 3 months. - POCT glucose (manual entry) - POCT glycosylated hemoglobin (Hb A1C) - HM Diabetes Foot Exam  6. Atherosclerosis of aorta (HCC) Start ASA. - aspirin 81 MG tablet; Take 1 tablet (81 mg total) by mouth daily.  Dispense: 100 tablet; Refill: 3   Fara Chute, PA-C Physician Assistant-Certified [facility]

## 2015-08-03 NOTE — Patient Instructions (Addendum)
Please schedule your annual eye exam! Start taking a baby aspirin each day. Work on the [employer] and regular exercise. If the glucose and A1C continue to rise, we'll need to restart the metformin.    IF you received an x-ray today, you will receive an invoice from [facility]. Please contact [facility] at [phone] with questions or concerns regarding your invoice.   IF you received labwork today, you will receive an invoice from [employer]. Please contact [facility] at [phone] with questions or concerns regarding your invoice.   Our billing staff will not be able to assist you with questions regarding bills from these companies.  You will be contacted with the lab results as soon as they are available. The fastest way to get your results is to activate your My Chart account. Instructions are located on the last page of this paperwork. If you have not heard from [place] regarding the results in 2 weeks, please contact this office.

## 2015-08-03 NOTE — Progress Notes (Signed)
Subjective:    Patient ID: Erika Mason, female    DOB: 1945-05-06, 70 y.o.   MRN: [id number]  Chief Complaint  Patient presents with  . Follow-up    6 month follow for bloodwork    HPI  Patient presents for HTN, hyperlipidemia, and T2DM follow up that is well controlled on medication. No side effects with current medication regimen.  She has no complaints at this time. She is trying to cut back on the cake and ice cream. She tries to walk occasionally but patient states "she wouldn't call it exercise".  Last Hgb A1c in 01/2015 was 6.2%. Not currently on diabetic medication. She preforms a foot exam daily.   She continues to smoke but is down to 3/4 pack a day.   She needs to schedule an eye exam. Last exam was March of 2016. Recent mammogram in December 2016 which was normal.  Current Outpatient Prescriptions on File Prior to Visit  Medication Sig Dispense Refill  . albuterol (PROVENTIL HFA;VENTOLIN HFA) 108 (90 BASE) MCG/ACT inhaler Inhale 2 puffs into the lungs every 4 (four) hours as needed for wheezing or shortness of breath. 3 Inhaler 1  . atorvastatin (LIPITOR) 40 MG tablet Take 1 tablet (40 mg total) by mouth daily. 90 tablet 0  . hydrochlorothiazide (HYDRODIURIL) 25 MG tablet TAKE 1 TABLET EVERY MORNING 90 tablet 1  . levothyroxine (SYNTHROID, LEVOTHROID) 88 MCG tablet Take 1 tablet (88 mcg total) by mouth daily. 90 tablet 3  . pantoprazole (PROTONIX) 40 MG tablet TAKE 1 TABLET BY MOUTH EVERY DAY 90 tablet 0  . sodium chloride (OCEAN) 0.65 % SOLN nasal spray Place 1 spray into both nostrils as needed for congestion.     No current facility-administered medications on file prior to visit.   Allergies  Allergen Reactions  . Actonel [Risedronate Sodium] Other (See Comments)    Chest Pain/Severe GERD      Review of Systems  Constitutional: Negative for fever, chills and fatigue.  HENT: Negative.   Respiratory: Negative for shortness of breath.     Cardiovascular: Negative for chest pain.  Gastrointestinal: Negative for nausea, vomiting, abdominal pain and diarrhea.  Genitourinary: Negative for dysuria and hematuria.  Musculoskeletal: Positive for joint swelling and arthralgias. Negative for myalgias.  Neurological: Negative for dizziness, syncope, light-headedness and numbness.       Objective:   Physical Exam  Constitutional: She is oriented to person, place, and time. She appears well-developed and well-nourished. No distress.  HENT:  Head: Normocephalic and atraumatic.  Right Ear: Tympanic membrane, external ear and ear canal normal.  Left Ear: Tympanic membrane, external ear and ear canal normal.  Nose: Nose normal.  Mouth/Throat: Oropharynx is clear and moist.  Eyes: Conjunctivae are normal. Pupils are equal, round, and reactive to light.  Neck: Normal range of motion. Neck supple. No thyromegaly present.  Cardiovascular: Normal rate, regular rhythm, normal heart sounds and intact distal pulses.   Pulses:      Radial pulses are 2+ on the right side, and 2+ on the left side.       Dorsalis pedis pulses are 2+ on the right side, and 2+ on the left side.  Pulmonary/Chest: Effort normal and breath sounds normal.  Abdominal: Soft. Bowel sounds are normal.  Lymphadenopathy:    She has no cervical adenopathy.  Neurological: She is alert and oriented to person, place, and time. She has normal reflexes.  Skin: Skin is warm and dry.  Psychiatric: She has a  normal mood and affect. Her behavior is normal. Judgment and thought content normal.   Results for orders placed or performed in visit on 08/03/15  POCT glucose (manual entry)  Result Value Ref Range   POC Glucose 143 (A) 70 - 99 mg/dl  POCT glycosylated hemoglobin (Hb A1C)  Result Value Ref Range   Hemoglobin A1C 6.7   POCT CBC  Result Value Ref Range   WBC 9.9 4.6 - 10.2 K/uL   Lymph, poc 1.8 0.6 - 3.4   POC LYMPH PERCENT 18.0 10 - 50 %L   MID (cbc) 0.6 0 - 0.9    POC MID % 6.0 0 - 12 %M   POC Granulocyte 7.5 (A) 2 - 6.9   Granulocyte percent 76.0 37 - 80 %G   RBC 5.02 4.04 - 5.48 M/uL   Hemoglobin 15.7 12.2 - 16.2 g/dL   HCT, POC 43.2 37.7 - 47.9 %   MCV 86.0 80 - 97 fL   MCH, POC 31.2 27 - 31.2 pg   MCHC 36.3 (A) 31.8 - 35.4 g/dL   RDW, POC 14.3 %   Platelet Count, POC 233 142 - 424 K/uL   MPV 8.2 0 - 99.8 fL           Assessment & Plan:  1. Essential hypertension Well controlled on current medication. Awaiting lab results. - POCT CBC - Comprehensive metabolic panel  2. Hyperlipidemia Awaiting lab results. - Lipid panel  3. Hypothyroidism, unspecified hypothyroidism type Awaiting lab results. - TSH  4. Smoker Continued to counsel patient on the importance of smoking cessation.  5. Type 2 diabetes mellitus without complication, without long-term current use of insulin (HCC) Elevated glucose today of 143. A1c has increased from 6.1 to 6.7 over the past 6 months. Patient refuses to start metformin. She will try to diet and exercise and then see [place] back in 3 month. If A1c is worse or not improving will put patient on metformin. She is agreeable with this plan. - POCT glucose (manual entry) - POCT glycosylated hemoglobin (Hb A1C) - HM Diabetes Foot Exam  6. Atherosclerosis of aorta (HCC) - aspirin 81 MG tablet; Take 1 tablet (81 mg total) by mouth daily.  Dispense: 100 tablet; Refill: 3  Melina Schools PA-S 08/03/2015

## 2015-08-03 NOTE — Telephone Encounter (Signed)
Called and spoke with patient about her lab results. Her TSH was elevated. I asked patient if she has been taking her Levothyroxine. She states she just got her refill in the mail on Friday and started taking the medication last Saturday (08/01/15). She had not been taking the medication a week prior to receiving the medication in the mail. She is aware and will continue to taking. She will make an appointment to follow up in 3 months to re evaluate her DM.

## 2015-08-03 NOTE — Progress Notes (Deleted)
   Subjective:    Patient ID: Erika Mason, female    DOB: [birth year], 70 y.o.   MRN: [id number]  Chief Complaint  Patient presents with  . Follow-up    6 month follow for bloodwork    HPI  Presents for evaluation of hyperlipidemia, HTN and hyperglycemia. These have all been well-controlled and she tolerates medications well without adverse effects.  She feels well. No complaints or concerns. Notes that since surgery to remove a large staghorn renal calculus she has had larger urine volumes than before. No increase in urgency or frequency.  Frequency of home glucose monitoring: Has not seen a dentist in the past6 months, needs to find a new one in her network. eye specialist annually, within the past year. Checks feet daily. Is not current with influenza vaccine. Is not current with pneumococcal vaccine (needs Prevnar-13).    Review of Systems     Objective:   Physical Exam        Assessment & Plan:

## 2015-08-04 ENCOUNTER — Encounter: Payer: Commercial Managed Care - HMO | Admitting: Physician Assistant

## 2015-08-07 ENCOUNTER — Other Ambulatory Visit: Payer: Self-pay | Admitting: Physician Assistant

## 2015-08-13 ENCOUNTER — Other Ambulatory Visit: Payer: Self-pay | Admitting: Family Medicine

## 2015-08-14 IMAGING — CR DG CHEST 1V PORT
1 series · 1 of 1 positions shown · non-contrast
Comparison: 08/16/2012 CT.

CLINICAL DATA: Postop cough.  History of COPD, smoking.

EXAM:
PORTABLE CHEST - 1 VIEW

[AP]
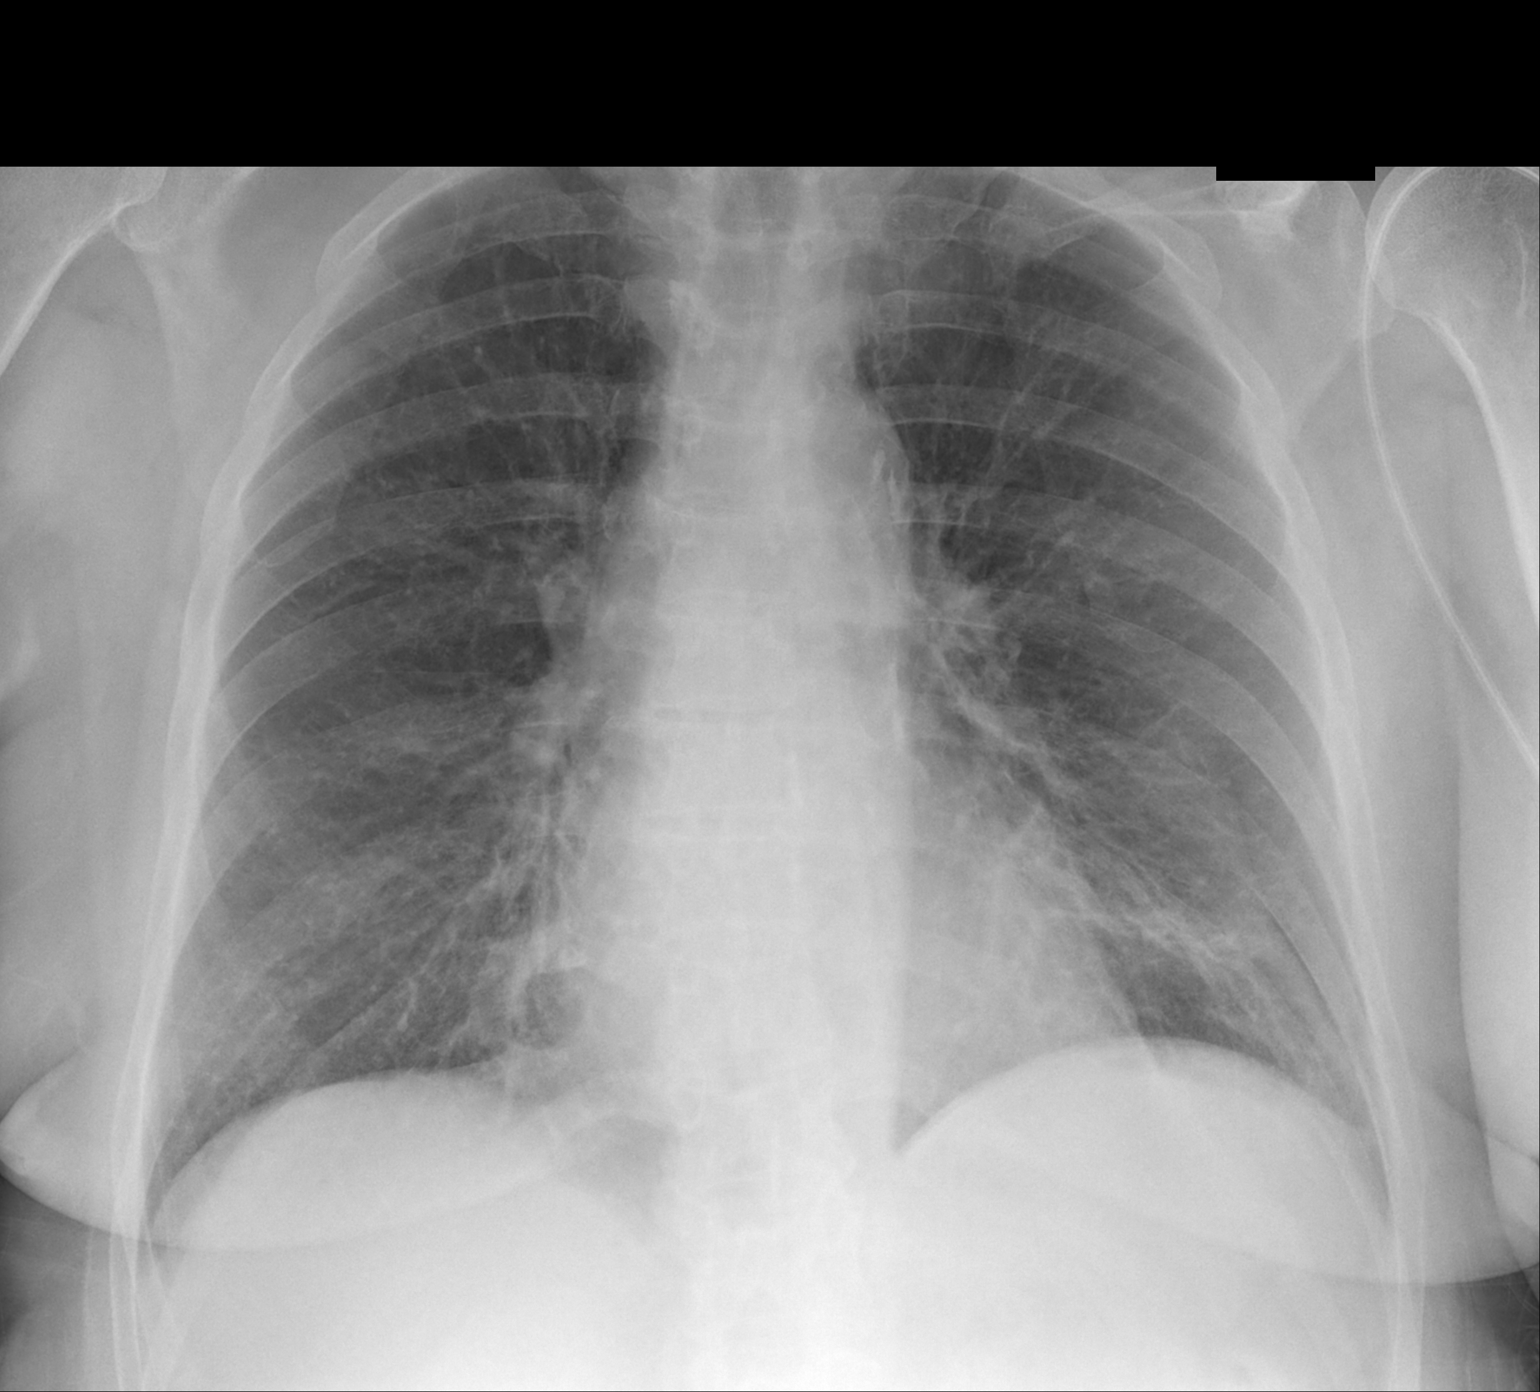

[1 of 1 positions shown; findings below may reference images not displayed]

FINDINGS: There is hyperinflation of the lungs compatible with COPD. Airspace
opacity noted in the lingula. Right lung is clear. No effusions.
Heart is normal size. Mediastinal contours are within normal limits.
IMPRESSION: Lingular opacity could reflect pneumonia.

COPD.

## 2015-09-08 ENCOUNTER — Other Ambulatory Visit: Payer: Self-pay | Admitting: Physician Assistant

## 2015-10-15 ENCOUNTER — Other Ambulatory Visit: Payer: Self-pay | Admitting: Family Medicine

## 2015-11-10 ENCOUNTER — Ambulatory Visit (INDEPENDENT_AMBULATORY_CARE_PROVIDER_SITE_OTHER): Payer: Commercial Managed Care - HMO | Admitting: Physician Assistant

## 2015-11-10 ENCOUNTER — Encounter: Payer: Self-pay | Admitting: Physician Assistant

## 2015-11-10 ENCOUNTER — Other Ambulatory Visit: Payer: Self-pay | Admitting: Physician Assistant

## 2015-11-10 VITALS — BP 138/72 | HR 86 | Temp 98.4°F | Resp 16 | Ht 62.75 in | Wt 160.0 lb

## 2015-11-10 DIAGNOSIS — F17200 Nicotine dependence, unspecified, uncomplicated: Secondary | ICD-10-CM

## 2015-11-10 DIAGNOSIS — E785 Hyperlipidemia, unspecified: Secondary | ICD-10-CM | POA: Diagnosis not present

## 2015-11-10 DIAGNOSIS — I10 Essential (primary) hypertension: Secondary | ICD-10-CM | POA: Diagnosis not present

## 2015-11-10 DIAGNOSIS — Z720 Tobacco use: Secondary | ICD-10-CM | POA: Diagnosis not present

## 2015-11-10 DIAGNOSIS — E119 Type 2 diabetes mellitus without complications: Secondary | ICD-10-CM | POA: Diagnosis not present

## 2015-11-10 DIAGNOSIS — E039 Hypothyroidism, unspecified: Secondary | ICD-10-CM

## 2015-11-10 LAB — COMPREHENSIVE METABOLIC PANEL
ALK PHOS: 107 U/L (ref 33–130)
ALT: 23 U/L (ref 6–29)
AST: 19 U/L (ref 10–35)
Albumin: 4.6 g/dL (ref 3.6–5.1)
BILIRUBIN TOTAL: 1.3 mg/dL — AB (ref 0.2–1.2)
BUN: 15 mg/dL (ref 7–25)
CO2: 22 mmol/L (ref 20–31)
Calcium: 9.7 mg/dL (ref 8.6–10.4)
Chloride: 100 mmol/L (ref 98–110)
Creat: 1.04 mg/dL — ABNORMAL HIGH (ref 0.60–0.93)
GLUCOSE: 129 mg/dL — AB (ref 65–99)
Potassium: 4.1 mmol/L (ref 3.5–5.3)
SODIUM: 139 mmol/L (ref 135–146)
Total Protein: 7.4 g/dL (ref 6.1–8.1)

## 2015-11-10 LAB — POCT URINALYSIS DIP (MANUAL ENTRY)
Bilirubin, UA: NEGATIVE
Glucose, UA: NEGATIVE
Ketones, POC UA: NEGATIVE
LEUKOCYTES UA: NEGATIVE
NITRITE UA: NEGATIVE
PH UA: 7
PROTEIN UA: NEGATIVE
Spec Grav, UA: 1.01
UROBILINOGEN UA: 0.2

## 2015-11-10 LAB — LIPID PANEL
CHOL/HDL RATIO: 2.9 ratio (ref <=5.0)
CHOLESTEROL: 148 mg/dL (ref 125–200)
HDL: 51 mg/dL (ref >=46)
LDL Cholesterol: 68 mg/dL (ref <130)
TRIGLYCERIDES: 146 mg/dL (ref <150)
VLDL: 29 mg/dL (ref <30)

## 2015-11-10 LAB — TSH: TSH: 2.93 m[IU]/L

## 2015-11-10 LAB — POC MICROSCOPIC URINALYSIS (UMFC): Mucus: ABSENT

## 2015-11-10 LAB — HEMOGLOBIN A1C
Hgb A1c MFr Bld: 6.9 % — ABNORMAL HIGH (ref <5.7)
Mean Plasma Glucose: 151 mg/dL

## 2015-11-10 NOTE — Progress Notes (Signed)
Subjective:    Patient ID: Erika Mason, female    DOB: 02/28/1946, 70 y.o.   MRN: [id number]  HPI  Erika Mason is here for follow up on HTN, hypothyroidism, hyperlipidemia, and type 2 DM w/o complication. She does not currently take anything for her diabetes, and wants to try to manage this without being put on another medication. She does not check her blood glucose levels. She has a home blood pressure monitor but does not check her BP regularly, when she does it is well controlled.  She has recently noticed some increased heartburn with eating greasy foods. Would like to lose weight, but has not made a lot of dietary changes. Does not get regular exercise. Walks around the grocery store and vacuums.   She also has a chronic cough with white-yellow thick sputum. She attributes this to sinus drainage.   Review of Systems  Constitutional: Positive for fatigue. Negative for appetite change, fever and unexpected weight change.  Eyes: Negative for visual disturbance.  Respiratory: Positive for cough. Negative for shortness of breath.   Cardiovascular: Positive for palpitations. Negative for chest pain and leg swelling.  Gastrointestinal: Negative for abdominal pain, constipation, diarrhea and nausea.  Genitourinary: Negative for difficulty urinating, frequency and urgency.  Skin: Negative.   Neurological: Negative for numbness and headaches.  Psychiatric/Behavioral: Negative for dysphoric mood and sleep disturbance. The patient is not nervous/anxious.     Current Outpatient Prescriptions:  .  albuterol (PROVENTIL HFA;VENTOLIN HFA) 108 (90 BASE) MCG/ACT inhaler, Inhale 2 puffs into the lungs every 4 (four) hours as needed for wheezing or shortness of breath., Disp: 3 Inhaler, Rfl: 1 .  aspirin 81 MG tablet, Take 1 tablet (81 mg total) by mouth daily., Disp: 100 tablet, Rfl: 3 .  atorvastatin (LIPITOR) 40 MG tablet, TAKE 1 TABLET EVERY DAY, Disp: 90 tablet, Rfl: 1 .  hydrochlorothiazide  (HYDRODIURIL) 25 MG tablet, TAKE 1 TABLET EVERY MORNING, Disp: 90 tablet, Rfl: 1 .  levothyroxine (SYNTHROID, LEVOTHROID) 88 MCG tablet, Take 1 tablet (88 mcg total) by mouth daily., Disp: 90 tablet, Rfl: 3 .  pantoprazole (PROTONIX) 40 MG tablet, TAKE 1 TABLET EVERY DAY, Disp: 30 tablet, Rfl: 0 .  sodium chloride (OCEAN) 0.65 % SOLN nasal spray, Place 1 spray into both nostrils as needed for congestion., Disp: , Rfl:    Allergies  Allergen Reactions  . Actonel [Risedronate Sodium] Other (See Comments)    Chest Pain/Severe GERD      Objective:   Physical Exam  Constitutional: She is oriented to person, place, and time. She appears well-developed and well-nourished. She is cooperative. No distress.  Blood pressure 138/72, pulse 86, temperature 98.4 F (36.9 C), temperature source Oral, resp. rate 16, height 5' 2.75" (1.594 m), weight 160 lb (72.6 kg), SpO2 96 %.  HENT:  Head: Normocephalic and atraumatic.  Neck: Trachea normal and normal range of motion. Neck supple. No thyromegaly present.  Cardiovascular: Normal rate, regular rhythm, normal heart sounds and intact distal pulses.   Pulmonary/Chest: Effort normal. She has wheezes in the right upper field and the left upper field.  Musculoskeletal: Normal range of motion.  Lymphadenopathy:    She has no cervical adenopathy.  Neurological: She is alert and oriented to person, place, and time. She has normal strength. No sensory deficit.  Skin: Skin is warm, dry and intact. No cyanosis. Nails show no clubbing.  Psychiatric: She has a normal mood and affect. Her speech is normal and behavior is normal.  Diabetic Foot Exam - Simple   Simple Foot Form Diabetic Foot exam was performed with the following findings:  Yes 11/10/2015 11:28 AM  Visual Inspection No deformities, no ulcerations, no other skin breakdown bilaterally:  Yes Sensation Testing Intact to touch and monofilament testing bilaterally:  Yes Pulse Check Posterior Tibialis  and Dorsalis pulse intact bilaterally:  Yes Comments         Assessment & Plan:  1. Essential hypertension - continue current management - POCT Microscopic Urinalysis ([facility]) - POCT urinalysis dipstick  2. Hypothyroidism, unspecified hypothyroidism type - continue current management - TSH  3. Type 2 diabetes mellitus without complication, without long-term current use of insulin (HCC) - encouraged increasing exercise and healthier eating - Comprehensive metabolic panel - Hemoglobin A1c  4. Hyperlipidemia - continue current management - Lipid panel  5. Smoker - counseled on smoking cessation and provided information on quitting smoking  Kelly Rayburn PA-S 11/10/15

## 2015-11-10 NOTE — Patient Instructions (Addendum)
Try to increase your exercise to 150 minutes each week.  Cut back on the fried/fatty foods. That will help the reflux AND help you lose weight.  Please see your eye specialist every year.    IF you received an x-ray today, you will receive an invoice from [facility]. Please contact [facility] at [phone] with questions or concerns regarding your invoice.   IF you received labwork today, you will receive an invoice from [employer]. Please contact [facility] at [phone] with questions or concerns regarding your invoice.   Our billing staff will not be able to assist you with questions regarding bills from these companies.  You will be contacted with the lab results as soon as they are available. The fastest way to get your results is to activate your My Chart account. Instructions are located on the last page of this paperwork. If you have not heard from [place] regarding the results in 2 weeks, please contact this office.     Did you know that you begin to benefit from quitting smoking within the first twenty minutes? It's TRUE.  At 20 minutes: -blood pressure decreases -pulse rate drops -body temperature of hands and feet increases  At 8 hours: -carbon monoxide level in blood drops to normal -oxygen level in blood increases to normal  At 24 hours: -the chance of heart attack decreases  At 48 hours: -nerve endings start regrowing -ability to smell and taste is enhanced  2 weeks-3 months: -circulation improves -walking becomes easier -lung function improves  1-9 months: -coughing, sinus congestion, fatigue and shortness of breath decreases  1 year: -excess risk of heart disease is decreased to HALF that of a smoker  5 years: Stroke risk is reduced to that of people who have never smoked  10 years: -risk of lung cancer drops to as little as half that of continuing smokers -risk of cancer of the mouth, throat, esophagus,  bladder, kidney and pancreas decreases -risk of ulcer decreases  15 years -risk of heart disease is now similar to that of people who have never smoked -risk of death returns to nearly the level of people who have never smoked

## 2015-11-10 NOTE — Progress Notes (Signed)
Patient ID: Erika Mason Section, female    DOB: Dec 21, 1945, 70 y.o.   MRN: [id number]  PCP: Harrison Mons, PA-C  Subjective:   Chief Complaint  Patient presents with  . Follow-up    Hypertension    HPI Presents for evaluation of HTN, diabetes and hyperlipidemia.  Diabetes is currently managed with lifestyle changes. No home monitoring.  Tolerating medications well, without adverse effects. Does not check BP at home.  Notes more heartburn lately, associated with increased consumption of greasy foods.  Minimal exercise. Continues to smoke. Cough productive of thick white sputum and associated with post-nasal drainage. No fever, chills. No ear pain or fullness, No CP, SOB.    Review of Systems Constitutional: Positive for fatigue. Negative for appetite change, fever and unexpected weight change.  Eyes: Negative for visual disturbance.  Respiratory: Positive for cough. Negative for shortness of breath.   Cardiovascular: Positive for palpitations. Negative for chest pain and leg swelling.  Gastrointestinal: Negative for abdominal pain, constipation, diarrhea and nausea.  Genitourinary: Negative for difficulty urinating, frequency and urgency.  Skin: Negative.   Neurological: Negative for numbness and headaches.  Psychiatric/Behavioral: Negative for dysphoric mood and sleep disturbance. The patient is not nervous/anxious.      Patient Active Problem List   Diagnosis Date Noted  . Smoker 07/08/2014  . Seasonal allergies 07/08/2014  . Atherosclerosis of aorta ([place]) 06/05/2014  . Atherosclerosis of artery 06/05/2014  . Hemorrhagic cystitis 05/17/2014  . HTN (hypertension) 08/14/2012  . Hyperlipidemia 08/14/2012  . Hypothyroidism 08/14/2012  . GERD (gastroesophageal reflux disease) 08/14/2012  . Lingular mass 08/14/2012  . Diabetes mellitus type 2, uncomplicated ([place]) [id number]  . Edema   . Low bone mass   . History of colon polyps   . Vitamin D insufficiency   .  Nephrolithiasis   . Diverticulosis      Prior to Admission medications   Medication Sig Start Date End Date Taking? Authorizing Provider  albuterol (PROVENTIL HFA;VENTOLIN HFA) 108 (90 BASE) MCG/ACT inhaler Inhale 2 puffs into the lungs every 4 (four) hours as needed for wheezing or shortness of breath. 07/08/14   Naziyah Tieszen, PA-C  aspirin 81 MG tablet Take 1 tablet (81 mg total) by mouth daily. 08/03/15   Payzlee Ryder, PA-C  atorvastatin (LIPITOR) 40 MG tablet TAKE 1 TABLET EVERY DAY 09/10/15   Aleli Navedo, PA-C  hydrochlorothiazide (HYDRODIURIL) 25 MG tablet TAKE 1 TABLET EVERY MORNING 08/09/15   Kasson Lamere, PA-C  levothyroxine (SYNTHROID, LEVOTHROID) 88 MCG tablet Take 1 tablet (88 mcg total) by mouth daily. 07/24/15   Lugene Hitt, PA-C  pantoprazole (PROTONIX) 40 MG tablet TAKE 1 TABLET EVERY DAY 10/16/15   Janos Shampine, PA-C  sodium chloride (OCEAN) 0.65 % SOLN nasal spray Place 1 spray into both nostrils as needed for congestion.    Historical Provider, MD     Allergies  Allergen Reactions  . Actonel [Risedronate Sodium] Other (See Comments)    Chest Pain/Severe GERD       Objective:  Physical Exam  Constitutional: She is oriented to person, place, and time. She appears well-developed and well-nourished. She is active and cooperative. No distress.  BP 138/72 (BP Location: Right Arm, Patient Position: Sitting, Cuff Size: Normal)   Pulse 86   Temp 98.4 F (36.9 C) (Oral)   Resp 16   Ht 5' 2.75" (1.594 m)   Wt 160 lb (72.6 kg)   SpO2 96%   BMI 28.57 kg/m   HENT:  Head: Normocephalic and atraumatic.  Right Ear: Hearing normal.  Left Ear: Hearing normal.  Eyes: Conjunctivae are normal. No scleral icterus.  Neck: Normal range of motion. Neck supple. No thyromegaly present.  Cardiovascular: Normal rate, regular rhythm and normal heart sounds.   Pulses:      Radial pulses are 2+ on the right side, and 2+ on the left side.  Pulmonary/Chest: Effort normal and  breath sounds normal. Wheezes: wheezes heard initially resolve with deep breathing.  Lymphadenopathy:       Head (right side): No tonsillar, no preauricular, no posterior auricular and no occipital adenopathy present.       Head (left side): No tonsillar, no preauricular, no posterior auricular and no occipital adenopathy present.    She has no cervical adenopathy.       Right: No supraclavicular adenopathy present.       Left: No supraclavicular adenopathy present.  Neurological: She is alert and oriented to person, place, and time. No sensory deficit.  Skin: Skin is warm, dry and intact. No rash noted. No cyanosis or erythema. Nails show no clubbing.  Psychiatric: She has a normal mood and affect. Her speech is normal and behavior is normal.   Diabetic Foot Exam - Simple   Simple Foot Form Diabetic Foot exam was performed with the following findings:  Yes 11/10/2015 11:28 AM  Visual Inspection No deformities, no ulcerations, no other skin breakdown bilaterally:  Yes Sensation Testing Intact to touch and monofilament testing bilaterally:  Yes Pulse Check Posterior Tibialis and Dorsalis pulse intact bilaterally:  Yes Comments            Assessment & Plan:   1. Essential hypertension Controlled, continue current treatment. - POCT Microscopic Urinalysis ([facility]) - POCT urinalysis dipstick  2. Hypothyroidism, unspecified hypothyroidism type Update TSH. Adjust levothyroxine dose if indicated. - TSH  3. Type 2 diabetes mellitus without complication, without long-term current use of insulin (HCC) Await A1C. Consider the addition of metformin. - Comprehensive metabolic panel - Hemoglobin A1c  4. Hyperlipidemia Goal LDL <70 - Lipid panel  5. Smoker Encouraged smoking cessation. Will improve cough, reduce BP.   Fara Chute, PA-C Physician Assistant-Certified [facility]

## 2015-12-28 ENCOUNTER — Other Ambulatory Visit: Payer: Self-pay | Admitting: Physician Assistant

## 2016-01-13 ENCOUNTER — Other Ambulatory Visit: Payer: Self-pay | Admitting: Physician Assistant

## 2016-01-25 ENCOUNTER — Other Ambulatory Visit: Payer: Self-pay | Admitting: Physician Assistant

## 2016-03-21 ENCOUNTER — Other Ambulatory Visit: Payer: Self-pay | Admitting: Physician Assistant

## 2016-05-06 ENCOUNTER — Other Ambulatory Visit: Payer: Self-pay | Admitting: Physician Assistant

## 2016-05-07 NOTE — Telephone Encounter (Signed)
11/10/15 last ov and labs

## 2016-06-07 ENCOUNTER — Other Ambulatory Visit: Payer: Self-pay | Admitting: Physician Assistant

## 2016-06-27 DIAGNOSIS — H524 Presbyopia: Secondary | ICD-10-CM | POA: Diagnosis not present

## 2016-06-27 LAB — HM DIABETES EYE EXAM

## 2016-07-13 ENCOUNTER — Other Ambulatory Visit: Payer: Self-pay | Admitting: Physician Assistant

## 2016-07-14 NOTE — Telephone Encounter (Signed)
Patient needs office visit. Already given a 90 day courtesy refill. Given one last 30 day refill. No more refills without an office visit.

## 2016-07-27 ENCOUNTER — Ambulatory Visit (INDEPENDENT_AMBULATORY_CARE_PROVIDER_SITE_OTHER): Payer: Medicare HMO | Admitting: Physician Assistant

## 2016-07-27 VITALS — BP 138/81 | HR 90 | Temp 97.9°F | Resp 16 | Ht 62.75 in | Wt 164.0 lb

## 2016-07-27 DIAGNOSIS — M8580 Other specified disorders of bone density and structure, unspecified site: Secondary | ICD-10-CM | POA: Diagnosis not present

## 2016-07-27 DIAGNOSIS — F17200 Nicotine dependence, unspecified, uncomplicated: Secondary | ICD-10-CM | POA: Diagnosis not present

## 2016-07-27 DIAGNOSIS — E039 Hypothyroidism, unspecified: Secondary | ICD-10-CM

## 2016-07-27 DIAGNOSIS — E559 Vitamin D deficiency, unspecified: Secondary | ICD-10-CM | POA: Diagnosis not present

## 2016-07-27 DIAGNOSIS — J302 Other seasonal allergic rhinitis: Secondary | ICD-10-CM | POA: Diagnosis not present

## 2016-07-27 DIAGNOSIS — I10 Essential (primary) hypertension: Secondary | ICD-10-CM | POA: Diagnosis not present

## 2016-07-27 DIAGNOSIS — K219 Gastro-esophageal reflux disease without esophagitis: Secondary | ICD-10-CM

## 2016-07-27 DIAGNOSIS — I700 Atherosclerosis of aorta: Secondary | ICD-10-CM | POA: Diagnosis not present

## 2016-07-27 DIAGNOSIS — E785 Hyperlipidemia, unspecified: Secondary | ICD-10-CM

## 2016-07-27 DIAGNOSIS — E119 Type 2 diabetes mellitus without complications: Secondary | ICD-10-CM | POA: Diagnosis not present

## 2016-07-27 MED ORDER — ALBUTEROL SULFATE HFA 108 (90 BASE) MCG/ACT IN AERS: 2.0000 | INHALATION_SPRAY | RESPIRATORY_TRACT | 1 refills | Status: DC | PRN

## 2016-07-27 MED ORDER — ATORVASTATIN CALCIUM 40 MG PO TABS: 40.0000 mg | ORAL_TABLET | Freq: Every day | ORAL | 3 refills | Status: DC

## 2016-07-27 MED ORDER — HYDROCHLOROTHIAZIDE 25 MG PO TABS: 25.0000 mg | ORAL_TABLET | Freq: Every morning | ORAL | 3 refills | Status: DC

## 2016-07-27 MED ORDER — PANTOPRAZOLE SODIUM 40 MG PO TBEC: 40.0000 mg | DELAYED_RELEASE_TABLET | Freq: Every day | ORAL | 3 refills | Status: DC

## 2016-07-27 MED ORDER — LEVOTHYROXINE SODIUM 88 MCG PO TABS: 88.0000 ug | ORAL_TABLET | Freq: Every day | ORAL | 3 refills | Status: DC

## 2016-07-27 NOTE — Assessment & Plan Note (Signed)
Has remained diet controlled since diagnosis 08/2012. Update A1C. If >7%, will initiate metformin.

## 2016-07-27 NOTE — Assessment & Plan Note (Signed)
Has been stable. Await lab results and adjust levothyroxine dose as needed.

## 2016-07-27 NOTE — Progress Notes (Signed)
Patient ID: [name], female    DOB: 1945/04/21, 71 y.o.   MRN: [id number]  PCP: Harrison Mons, PA-C  Chief Complaint  Patient presents with  . Medication Refill    Levothyroxine, ALbuterol, Protonix, HCTZ , Prescriptions need to be 3 months to be covered by insurance,    Subjective:   Presents for evaluation of her chronic medical problems.  She reports feeling well, without problems or concerns. She is only here because she needs medication refills.  No CP, SOB, HA, dizziness. No GI/GU symptoms.  Has seen her eye specialist. Not interested in additional evaluation/treatment for low bone mass beyond vitamin D and calcium supplementation.  Lipids and HTN have been well controlled, though she continues to smoke. She feels guilty about smoking, but enjoys it-one of only a few of her life's real pleasures. She understands the risks associated with continued smoking.  She does not currently have HCPOA or Living Will.    Review of Systems Denies chest pain, shortness of breath, HA, dizziness, vision change, nausea, vomiting, diarrhea, constipation, melena, hematochezia, dysuria, increased urinary urgency or frequency, increased hunger or thirst, unintentional weight change, unexplained myalgias or arthralgias, rash.     Patient Active Problem List   Diagnosis Date Noted  . Smoker 07/08/2014  . Seasonal allergies 07/08/2014  . Atherosclerosis of aorta ([place]) 06/05/2014  . Atherosclerosis of artery 06/05/2014  . Hemorrhagic cystitis 05/17/2014  . HTN (hypertension) 08/14/2012  . Hyperlipidemia 08/14/2012  . Hypothyroidism 08/14/2012  . GERD (gastroesophageal reflux disease) 08/14/2012  . Lingular mass 08/14/2012  . Diabetes mellitus type 2, uncomplicated ([place]) 44/81/8563  . Edema   . Low bone mass   . History of colon polyps   . Vitamin D insufficiency   . Nephrolithiasis   . Diverticulosis      Prior to Admission medications   Medication Sig Start  Date End Date Taking? Authorizing Provider  albuterol (PROVENTIL HFA;VENTOLIN HFA) 108 (90 Base) MCG/ACT inhaler Inhale 2 puffs into the lungs every 4 (four) hours as needed for wheezing or shortness of breath. 07/27/16  Yes Malka Bocek, PA-C  aspirin 81 MG tablet Take 1 tablet (81 mg total) by mouth daily. 08/03/15  Yes Makyiah Lie, PA-C  atorvastatin (LIPITOR) 40 MG tablet TAKE 1 TABLET EVERY DAY 06/07/16  Yes Loletha Bertini, PA-C  hydrochlorothiazide (HYDRODIURIL) 25 MG tablet Take 1 tablet (25 mg total) by mouth every morning. Office visit needed for additional refills. 2nd and final notice. 07/14/16  Yes Jaynee Eagles, PA-C  levothyroxine (SYNTHROID, LEVOTHROID) 88 MCG tablet Take 1 tablet (88 mcg total) by mouth daily. Office visit needed for additional refills. 2nd and final notice. 07/14/16  Yes Jaynee Eagles, PA-C  pantoprazole (PROTONIX) 40 MG tablet TAKE 1 TABLET EVERY DAY 03/22/16  Yes Leonia Heatherly, PA-C  sodium chloride (OCEAN) 0.65 % SOLN nasal spray Place 1 spray into both nostrils as needed for congestion.   Yes Historical Provider, MD     Allergies  Allergen Reactions  . Actonel [Risedronate Sodium] Other (See Comments)    Chest Pain/Severe GERD       Objective:  Physical Exam  Constitutional: She is oriented to person, place, and time. She appears well-developed and well-nourished. She is active and cooperative. No distress.  BP 138/81   Pulse 90   Temp 97.9 F (36.6 C) (Oral)   Resp 16   Ht 5' 2.75" (1.594 m)   Wt 164 lb (74.4 kg)   SpO2 95%  BMI 29.28 kg/m   HENT:  Head: Normocephalic and atraumatic.  Right Ear: Hearing normal.  Left Ear: Hearing normal.  Eyes: Conjunctivae are normal. No scleral icterus.  Neck: Normal range of motion. Neck supple. No thyromegaly present.  Cardiovascular: Normal rate, regular rhythm and normal heart sounds.   Pulses:      Radial pulses are 2+ on the right side, and 2+ on the left side.  1+ pitting edema RLE, trace LLE    Pulmonary/Chest: Effort normal and breath sounds normal.  Lymphadenopathy:       Head (right side): No tonsillar, no preauricular, no posterior auricular and no occipital adenopathy present.       Head (left side): No tonsillar, no preauricular, no posterior auricular and no occipital adenopathy present.    She has no cervical adenopathy.       Right: No supraclavicular adenopathy present.       Left: No supraclavicular adenopathy present.  Neurological: She is alert and oriented to person, place, and time. No sensory deficit.  Skin: Skin is warm, dry and intact. No rash noted. No cyanosis or erythema. Nails show no clubbing.  Psychiatric: She has a normal mood and affect. Her speech is normal and behavior is normal.           Assessment & Plan:   Problem List Items Addressed This Visit    HTN (hypertension) - Primary (Chronic)    Controlled. Continue current treatment. Encouraged smoking cessation.      Relevant Medications   hydrochlorothiazide (HYDRODIURIL) 25 MG tablet   atorvastatin (LIPITOR) 40 MG tablet   Other Relevant Orders   CBC with Differential/Platelet   Comprehensive metabolic panel   Hyperlipidemia (Chronic)    Has been well controlled. Continue current treatment.      Relevant Medications   hydrochlorothiazide (HYDRODIURIL) 25 MG tablet   atorvastatin (LIPITOR) 40 MG tablet   Hypothyroidism (Chronic)    Has been stable. Await lab results and adjust levothyroxine dose as needed.      Relevant Medications   levothyroxine (SYNTHROID, LEVOTHROID) 88 MCG tablet   Other Relevant Orders   TSH   GERD (gastroesophageal reflux disease) (Chronic)    Controlled. Continue PPI therapy.      Relevant Medications   pantoprazole (PROTONIX) 40 MG tablet   Seasonal allergies (Chronic)    Generally well controlled. Continue PRN OTC treatment. If needing albuterol rescue inhaler more frequently, would consider adding daily steroid nasal spray.      Low bone mass     Update vitamin D level today. Continues to decline update DEXA. Continue Calcium and vitamin D supplements.      Vitamin D insufficiency    Update level today. Known low bone mass. Continue supplementation.      Relevant Orders   VITAMIN D 25 Hydroxy (Vit-D Deficiency, Fractures)   Diabetes mellitus type 2, uncomplicated ([place])    Has remained diet controlled since diagnosis 08/2012. Update A1C. If >7%, will initiate metformin.      Relevant Medications   atorvastatin (LIPITOR) 40 MG tablet   Other Relevant Orders   Hemoglobin A1c   Microalbumin, urine   Atherosclerosis of aorta (HCC)    Encouraged smoking cessation. Continue efforts for good DM and lipid control.      Relevant Medications   hydrochlorothiazide (HYDRODIURIL) 25 MG tablet   atorvastatin (LIPITOR) 40 MG tablet   Smoker    Encouraged smoking cessation, though she isn't really interested. Reviewed risks.  Relevant Medications   albuterol (PROVENTIL HFA;VENTOLIN HFA) 108 (90 Base) MCG/ACT inhaler       Return in about 4 months (around 11/26/2016) for re-evaluation of blood pressure, sugar and cholesterol.   Fara Chute, PA-C [facility] [place]

## 2016-07-27 NOTE — Patient Instructions (Signed)
     IF you received an x-ray today, you will receive an invoice from [facility]. Please contact [facility] at [phone] with questions or concerns regarding your invoice.   IF you received labwork today, you will receive an invoice from [facility]. Please contact [facility] at [phone] with questions or concerns regarding your invoice.   Our billing staff will not be able to assist you with questions regarding bills from these companies.  You will be contacted with the lab results as soon as they are available. The fastest way to get your results is to activate your My Chart account. Instructions are located on the last page of this paperwork. If you have not heard from us regarding the results in 2 weeks, please contact this office.     

## 2016-07-27 NOTE — Assessment & Plan Note (Signed)
Has been well controlled. Continue current treatment. 

## 2016-07-27 NOTE — Assessment & Plan Note (Signed)
Encouraged smoking cessation. Continue efforts for good DM and lipid control.

## 2016-07-27 NOTE — Assessment & Plan Note (Signed)
Controlled. Continue PPI therapy.

## 2016-07-27 NOTE — Assessment & Plan Note (Signed)
Encouraged smoking cessation, though she isn't really interested. Reviewed risks.

## 2016-07-27 NOTE — Assessment & Plan Note (Signed)
Controlled. Continue current treatment. Encouraged smoking cessation.

## 2016-07-27 NOTE — Assessment & Plan Note (Signed)
Generally well controlled. Continue PRN OTC treatment. If needing albuterol rescue inhaler more frequently, would consider adding daily steroid nasal spray.

## 2016-07-27 NOTE — Assessment & Plan Note (Signed)
Update level today. Known low bone mass. Continue supplementation.

## 2016-07-27 NOTE — Assessment & Plan Note (Signed)
Update vitamin D level today. Continues to decline update DEXA. Continue Calcium and vitamin D supplements.

## 2016-07-28 LAB — CBC WITH DIFFERENTIAL/PLATELET
BASOS ABS: 0 10*3/uL (ref 0.0–0.2)
BASOS: 0 %
EOS (ABSOLUTE): 0.1 10*3/uL (ref 0.0–0.4)
Eos: 2 %
HEMATOCRIT: 45 % (ref 34.0–46.6)
HEMOGLOBIN: 15.4 g/dL (ref 11.1–15.9)
IMMATURE GRANS (ABS): 0 10*3/uL (ref 0.0–0.1)
Immature Granulocytes: 0 %
LYMPHS ABS: 1.6 10*3/uL (ref 0.7–3.1)
Lymphs: 17 %
MCH: 31.4 pg (ref 26.6–33.0)
MCHC: 34.2 g/dL (ref 31.5–35.7)
MCV: 92 fL (ref 79–97)
MONOCYTES: 5 %
Monocytes Absolute: 0.5 10*3/uL (ref 0.1–0.9)
NEUTROS ABS: 7.3 10*3/uL — AB (ref 1.4–7.0)
Neutrophils: 76 %
Platelets: 245 10*3/uL (ref 150–379)
RBC: 4.91 x10E6/uL (ref 3.77–5.28)
RDW: 13.6 % (ref 12.3–15.4)
WBC: 9.6 10*3/uL (ref 3.4–10.8)

## 2016-07-28 LAB — HEMOGLOBIN A1C
ESTIMATED AVERAGE GLUCOSE: 160 mg/dL
HEMOGLOBIN A1C: 7.2 % — AB (ref 4.8–5.6)

## 2016-07-28 LAB — TSH: TSH: 3.01 u[IU]/mL (ref 0.450–4.500)

## 2016-07-28 LAB — COMPREHENSIVE METABOLIC PANEL
A/G RATIO: 1.8 (ref 1.2–2.2)
ALT: 25 IU/L (ref 0–32)
AST: 19 IU/L (ref 0–40)
Albumin: 4.4 g/dL (ref 3.5–4.8)
Alkaline Phosphatase: 100 IU/L (ref 39–117)
BUN/Creatinine Ratio: 18 (ref 12–28)
BUN: 20 mg/dL (ref 8–27)
Bilirubin Total: 0.5 mg/dL (ref 0.0–1.2)
CALCIUM: 9.6 mg/dL (ref 8.7–10.3)
CO2: 25 mmol/L (ref 18–29)
Chloride: 99 mmol/L (ref 96–106)
Creatinine, Ser: 1.1 mg/dL — ABNORMAL HIGH (ref 0.57–1.00)
GFR, EST AFRICAN AMERICAN: 58 mL/min/{1.73_m2} — AB (ref >59)
GFR, EST NON AFRICAN AMERICAN: 51 mL/min/{1.73_m2} — AB (ref >59)
Globulin, Total: 2.4 g/dL (ref 1.5–4.5)
Glucose: 170 mg/dL — ABNORMAL HIGH (ref 65–99)
POTASSIUM: 4.2 mmol/L (ref 3.5–5.2)
Sodium: 140 mmol/L (ref 134–144)
TOTAL PROTEIN: 6.8 g/dL (ref 6.0–8.5)

## 2016-07-28 LAB — MICROALBUMIN, URINE: Microalbumin, Urine: 4.3 ug/mL

## 2016-07-28 LAB — VITAMIN D 25 HYDROXY (VIT D DEFICIENCY, FRACTURES): VIT D 25 HYDROXY: 30.9 ng/mL (ref 30.0–100.0)

## 2016-08-10 ENCOUNTER — Other Ambulatory Visit: Payer: Self-pay

## 2016-08-10 MED ORDER — GLIPIZIDE 5 MG PO TABS
5.0000 mg | ORAL_TABLET | Freq: Every day | ORAL | 1 refills | Status: DC
Start: 2016-08-10 — End: 2017-03-07

## 2016-08-26 ENCOUNTER — Telehealth: Payer: Self-pay | Admitting: Family Medicine

## 2016-08-26 NOTE — Telephone Encounter (Signed)
LMOM TO CB AND RESCHEDULE AN OV WITH CHELLE SHE HAD AN APPOINTMENT WITH HER ON 11-11-16 AND THE PROVIDER WILL NOT BE IN THE OFFICE THAT DAY

## 2016-11-11 ENCOUNTER — Ambulatory Visit: Payer: Medicare HMO | Admitting: Physician Assistant

## 2016-11-29 ENCOUNTER — Ambulatory Visit (INDEPENDENT_AMBULATORY_CARE_PROVIDER_SITE_OTHER): Payer: Medicare HMO | Admitting: Physician Assistant

## 2016-11-29 ENCOUNTER — Encounter: Payer: Self-pay | Admitting: Physician Assistant

## 2016-11-29 VITALS — BP 127/79 | HR 91 | Temp 98.0°F | Resp 18 | Ht 62.75 in | Wt 165.8 lb

## 2016-11-29 DIAGNOSIS — E785 Hyperlipidemia, unspecified: Secondary | ICD-10-CM | POA: Diagnosis not present

## 2016-11-29 DIAGNOSIS — F17200 Nicotine dependence, unspecified, uncomplicated: Secondary | ICD-10-CM | POA: Diagnosis not present

## 2016-11-29 DIAGNOSIS — E119 Type 2 diabetes mellitus without complications: Secondary | ICD-10-CM

## 2016-11-29 DIAGNOSIS — I10 Essential (primary) hypertension: Secondary | ICD-10-CM | POA: Diagnosis not present

## 2016-11-29 DIAGNOSIS — E559 Vitamin D deficiency, unspecified: Secondary | ICD-10-CM | POA: Diagnosis not present

## 2016-11-29 NOTE — Progress Notes (Signed)
Patient ID: Erika Mason Section, female    DOB: [birth year], 71 y.o.   MRN: [id number]  PCP: Harrison Mons, PA-C  Chief Complaint  Patient presents with  . Hypertension  . Hyperlipidemia  . Diabetes    pt states when she takes the glipizide she got really jittery and broke out into a sweat and hot flashes.  . Follow-up    Subjective:   Presents for evaluation of HTN, lipids and diabetes.  Hypoglycemic symptoms with glipizide dose, about 1 hour after taking it. Lasts 10-20 minutes. Takes the medication, then waits 30 minutes to eat. Grabs a piece of toast or "whatever is in the fridge to address the symptoms. Breakfast: bowl of cereal or toast and jelly or sausage biscuit or cantaloupe. Doesn't check glucose at home. Refuses to check herself.  Otherwise, feels well. No CP, SOB, HA. No urinary symptoms. No unexplained muscle/joint pain.   Review of Systems  Constitutional: Negative for activity change, appetite change, fatigue and unexpected weight change.  HENT: Negative for congestion, dental problem, ear pain, hearing loss, mouth sores, postnasal drip, rhinorrhea, sneezing, sore throat, tinnitus and trouble swallowing.   Eyes: Negative for photophobia, pain, redness and visual disturbance.  Respiratory: Negative for cough, chest tightness and shortness of breath.   Cardiovascular: Negative for chest pain, palpitations and leg swelling.  Gastrointestinal: Negative for abdominal pain, blood in stool, constipation, diarrhea, nausea and vomiting.  Endocrine: Negative for cold intolerance, heat intolerance, polydipsia, polyphagia and polyuria.  Genitourinary: Negative for dysuria, frequency, hematuria and urgency.  Musculoskeletal: Negative for arthralgias, gait problem, myalgias and neck stiffness.  Skin: Negative for rash.  Neurological: Negative for dizziness, speech difficulty, weakness, light-headedness, numbness and headaches.  Hematological: Negative for adenopathy.    Psychiatric/Behavioral: Negative for confusion and sleep disturbance. The patient is not nervous/anxious.      Patient Active Problem List   Diagnosis Date Noted  . Smoker 07/08/2014  . Seasonal allergies 07/08/2014  . Atherosclerosis of aorta ([place]) 06/05/2014  . Atherosclerosis of artery 06/05/2014  . Hemorrhagic cystitis 05/17/2014  . HTN (hypertension) 08/14/2012  . Hyperlipidemia 08/14/2012  . Hypothyroidism 08/14/2012  . GERD (gastroesophageal reflux disease) 08/14/2012  . Lingular mass 08/14/2012  . Diabetes mellitus type 2, uncomplicated ([place]) 40/81/4481  . Edema   . Low bone mass   . History of colon polyps   . Vitamin D insufficiency   . Nephrolithiasis   . Diverticulosis      Prior to Admission medications   Medication Sig Start Date End Date Taking? Authorizing Provider  albuterol (PROVENTIL HFA;VENTOLIN HFA) 108 (90 Base) MCG/ACT inhaler Inhale 2 puffs into the lungs every 4 (four) hours as needed for wheezing or shortness of breath. 07/27/16  Yes Donella Pascarella, PA-C  aspirin 81 MG tablet Take 1 tablet (81 mg total) by mouth daily. 08/03/15  Yes Kamron Vanwyhe, PA-C  atorvastatin (LIPITOR) 40 MG tablet Take 1 tablet (40 mg total) by mouth daily. 07/27/16  Yes Edrie Ehrich, PA-C  glipiZIDE (GLUCOTROL) 5 MG tablet Take 1 tablet (5 mg total) by mouth daily. 08/10/16  Yes Illeana Edick, PA-C  hydrochlorothiazide (HYDRODIURIL) 25 MG tablet Take 1 tablet (25 mg total) by mouth every morning. 07/27/16  Yes Arlett Goold, PA-C  levothyroxine (SYNTHROID, LEVOTHROID) 88 MCG tablet Take 1 tablet (88 mcg total) by mouth daily. 07/27/16  Yes Deondria Puryear, PA-C  pantoprazole (PROTONIX) 40 MG tablet Take 1 tablet (40 mg total) by mouth daily. 07/27/16  Yes Harrison Mons, PA-C  sodium chloride (OCEAN) 0.65 % SOLN nasal spray Place 1 spray into both nostrils as needed for congestion.   Yes [provider]     Allergies  Allergen Reactions  . Actonel [Risedronate  Sodium] Other (See Comments)    Chest Pain/Severe GERD       Objective:  Physical Exam  Constitutional: She is oriented to person, place, and time. She appears well-developed and well-nourished. She is active and cooperative. No distress.  BP 127/79 (BP Location: Right Arm, Patient Position: Sitting, Cuff Size: Normal)   Pulse 91   Temp 98 F (36.7 C) (Oral)   Resp 18   Ht 5' 2.75" (1.594 m)   Wt 165 lb 12.8 oz (75.2 kg)   SpO2 96%   BMI 29.60 kg/m   HENT:  Head: Normocephalic and atraumatic.  Right Ear: Hearing normal.  Left Ear: Hearing normal.  Eyes: Conjunctivae are normal. No scleral icterus.  Neck: Normal range of motion. Neck supple. No thyromegaly present.  Cardiovascular: Normal rate, regular rhythm and normal heart sounds.   Pulses:      Radial pulses are 2+ on the right side, and 2+ on the left side.  Pulmonary/Chest: Effort normal and breath sounds normal.  Lymphadenopathy:       Head (right side): No tonsillar, no preauricular, no posterior auricular and no occipital adenopathy present.       Head (left side): No tonsillar, no preauricular, no posterior auricular and no occipital adenopathy present.    She has no cervical adenopathy.       Right: No supraclavicular adenopathy present.       Left: No supraclavicular adenopathy present.  Neurological: She is alert and oriented to person, place, and time. No sensory deficit.  Skin: Skin is warm, dry and intact. No rash noted. No cyanosis or erythema. Nails show no clubbing.  Psychiatric: She has a normal mood and affect. Her speech is normal and behavior is normal.       Wt Readings from Last 3 Encounters:  11/29/16 165 lb 12.8 oz (75.2 kg)  07/27/16 164 lb (74.4 kg)  11/10/15 160 lb (72.6 kg)       Assessment & Plan:   Problem List Items Addressed This Visit    HTN (hypertension) - Primary (Chronic)    Controlled. Stable. Continue current treatment.       Relevant Orders   CBC with  Differential/Platelet (Completed)   Comprehensive metabolic panel (Completed)   TSH (Completed)   Hyperlipidemia (Chronic)    Goal LDL <70. Await labs. Adjust regimen as indicated by results.       Relevant Orders   Comprehensive metabolic panel (Completed)   Lipid panel (Completed)   Vitamin D insufficiency    Await labs. Adjust regimen as indicated by results.       Relevant Orders   VITAMIN D 25 Hydroxy (Vit-D Deficiency, Fractures) (Completed)   Diabetes mellitus type 2, uncomplicated ([place])    Await labs. Adjust regimen as indicated by results.       Relevant Orders   Comprehensive metabolic panel (Completed)   Hemoglobin A1c (Completed)   Smoker    Again reviewed risks associated with persistent tobacco use. She is not interested in cessation.          Return in about 3 months (around 03/01/2017) for re-evaluation of diabetes, blood pressure.   Fara Chute, PA-C [facility]

## 2016-11-29 NOTE — Patient Instructions (Addendum)
Add some protein to your breakfasts: a little peanut butter on the toast, or an egg, etc.  We may need to reduce the glipizide dose.    IF you received an x-ray today, you will receive an invoice from [facility]. Please contact [facility] at [phone] with questions or concerns regarding your invoice.   IF you received labwork today, you will receive an invoice from [facility]. Please contact [facility] at [phone] with questions or concerns regarding your invoice.   Our billing staff will not be able to assist you with questions regarding bills from these companies.  You will be contacted with the lab results as soon as they are available. The fastest way to get your results is to activate your My Chart account. Instructions are located on the last page of this paperwork. If you have not heard from [place] regarding the results in 2 weeks, please contact this office.    Did you know that you begin to benefit from quitting smoking within the first twenty minutes? It's TRUE.  At 20 minutes: -blood pressure decreases -pulse rate drops -body temperature of hands and feet increases  At 8 hours: -carbon monoxide level in blood drops to normal -oxygen level in blood increases to normal  At 24 hours: -the chance of heart attack decreases  At 48 hours: -nerve endings start regrowing -ability to smell and taste is enhanced  2 weeks-3 months: -circulation improves -walking becomes easier -lung function improves  1-9 months: -coughing, sinus congestion, fatigue and shortness of breath decreases  1 year: -excess risk of heart disease is decreased to HALF that of a smoker  5 years: Stroke risk is reduced to that of people who have never smoked  10 years: -risk of lung cancer drops to as little as half that of continuing smokers -risk of cancer of the mouth, throat, esophagus, bladder, kidney and pancreas decreases -risk of ulcer decreases  15 years -risk of  heart disease is now similar to that of people who have never smoked -risk of death returns to nearly the level of people who have never smoked

## 2016-11-30 LAB — COMPREHENSIVE METABOLIC PANEL
ALK PHOS: 103 IU/L (ref 39–117)
ALT: 28 IU/L (ref 0–32)
AST: 20 IU/L (ref 0–40)
Albumin/Globulin Ratio: 1.8 (ref 1.2–2.2)
Albumin: 4.6 g/dL (ref 3.5–4.8)
BILIRUBIN TOTAL: 0.6 mg/dL (ref 0.0–1.2)
BUN/Creatinine Ratio: 16 (ref 12–28)
BUN: 18 mg/dL (ref 8–27)
CHLORIDE: 97 mmol/L (ref 96–106)
CO2: 23 mmol/L (ref 20–29)
CREATININE: 1.16 mg/dL — AB (ref 0.57–1.00)
Calcium: 9.8 mg/dL (ref 8.7–10.3)
GFR calc Af Amer: 55 mL/min/{1.73_m2} — ABNORMAL LOW (ref >59)
GFR calc non Af Amer: 47 mL/min/{1.73_m2} — ABNORMAL LOW (ref >59)
GLOBULIN, TOTAL: 2.6 g/dL (ref 1.5–4.5)
GLUCOSE: 153 mg/dL — AB (ref 65–99)
Potassium: 4.6 mmol/L (ref 3.5–5.2)
SODIUM: 141 mmol/L (ref 134–144)
Total Protein: 7.2 g/dL (ref 6.0–8.5)

## 2016-11-30 LAB — CBC WITH DIFFERENTIAL/PLATELET
BASOS ABS: 0 10*3/uL (ref 0.0–0.2)
Basos: 0 %
EOS (ABSOLUTE): 0.2 10*3/uL (ref 0.0–0.4)
Eos: 2 %
HEMOGLOBIN: 15.4 g/dL (ref 11.1–15.9)
Hematocrit: 44.7 % (ref 34.0–46.6)
IMMATURE GRANS (ABS): 0 10*3/uL (ref 0.0–0.1)
IMMATURE GRANULOCYTES: 0 %
LYMPHS: 17 %
Lymphocytes Absolute: 1.7 10*3/uL (ref 0.7–3.1)
MCH: 31 pg (ref 26.6–33.0)
MCHC: 34.5 g/dL (ref 31.5–35.7)
MCV: 90 fL (ref 79–97)
MONOCYTES: 6 %
Monocytes Absolute: 0.6 10*3/uL (ref 0.1–0.9)
NEUTROS PCT: 75 %
Neutrophils Absolute: 7.5 10*3/uL — ABNORMAL HIGH (ref 1.4–7.0)
Platelets: 259 10*3/uL (ref 150–379)
RBC: 4.96 x10E6/uL (ref 3.77–5.28)
RDW: 14.5 % (ref 12.3–15.4)
WBC: 10 10*3/uL (ref 3.4–10.8)

## 2016-11-30 LAB — LIPID PANEL
CHOL/HDL RATIO: 3.3 ratio (ref 0.0–4.4)
Cholesterol, Total: 157 mg/dL (ref 100–199)
HDL: 48 mg/dL (ref >39)
LDL Calculated: 76 mg/dL (ref 0–99)
Triglycerides: 167 mg/dL — ABNORMAL HIGH (ref 0–149)
VLDL Cholesterol Cal: 33 mg/dL (ref 5–40)

## 2016-11-30 LAB — VITAMIN D 25 HYDROXY (VIT D DEFICIENCY, FRACTURES): VIT D 25 HYDROXY: 26.1 ng/mL — AB (ref 30.0–100.0)

## 2016-11-30 LAB — HEMOGLOBIN A1C
ESTIMATED AVERAGE GLUCOSE: 146 mg/dL
HEMOGLOBIN A1C: 6.7 % — AB (ref 4.8–5.6)

## 2016-11-30 LAB — TSH: TSH: 4.05 u[IU]/mL (ref 0.450–4.500)

## 2016-12-15 NOTE — Assessment & Plan Note (Signed)
Controlled. Stable. Continue current treatment.

## 2016-12-15 NOTE — Assessment & Plan Note (Signed)
Again reviewed risks associated with persistent tobacco use. She is not interested in cessation.

## 2016-12-15 NOTE — Assessment & Plan Note (Addendum)
Goal LDL <70. Await labs. Adjust regimen as indicated by results.

## 2016-12-15 NOTE — Assessment & Plan Note (Signed)
Await labs. Adjust regimen as indicated by results.  

## 2016-12-21 ENCOUNTER — Other Ambulatory Visit: Payer: Self-pay | Admitting: Physician Assistant

## 2017-02-09 ENCOUNTER — Telehealth: Payer: Self-pay

## 2017-02-09 NOTE — Telephone Encounter (Signed)
Called pt to schedule Medicare Annual Wellness Visit. -nr  

## 2017-03-07 ENCOUNTER — Other Ambulatory Visit: Payer: Self-pay

## 2017-03-07 ENCOUNTER — Encounter: Payer: Self-pay | Admitting: Physician Assistant

## 2017-03-07 ENCOUNTER — Ambulatory Visit: Payer: Medicare HMO | Admitting: Physician Assistant

## 2017-03-07 ENCOUNTER — Telehealth: Payer: Self-pay | Admitting: Physician Assistant

## 2017-03-07 VITALS — BP 122/74 | HR 88 | Temp 98.7°F | Resp 18 | Ht 62.75 in | Wt 165.8 lb

## 2017-03-07 DIAGNOSIS — I10 Essential (primary) hypertension: Secondary | ICD-10-CM

## 2017-03-07 DIAGNOSIS — E119 Type 2 diabetes mellitus without complications: Secondary | ICD-10-CM

## 2017-03-07 DIAGNOSIS — E785 Hyperlipidemia, unspecified: Secondary | ICD-10-CM | POA: Diagnosis not present

## 2017-03-07 DIAGNOSIS — Z23 Encounter for immunization: Secondary | ICD-10-CM

## 2017-03-07 DIAGNOSIS — E559 Vitamin D deficiency, unspecified: Secondary | ICD-10-CM | POA: Diagnosis not present

## 2017-03-07 DIAGNOSIS — F17200 Nicotine dependence, unspecified, uncomplicated: Secondary | ICD-10-CM | POA: Diagnosis not present

## 2017-03-07 DIAGNOSIS — E039 Hypothyroidism, unspecified: Secondary | ICD-10-CM | POA: Diagnosis not present

## 2017-03-07 MED ORDER — ALBUTEROL SULFATE HFA 108 (90 BASE) MCG/ACT IN AERS: 2.0000 | INHALATION_SPRAY | RESPIRATORY_TRACT | 1 refills | Status: DC | PRN

## 2017-03-07 NOTE — Telephone Encounter (Signed)
[facility] and they state that they need release of information form before releasing paperwork. I also stated that we were her pcp and they still needed documentation. They stated that the pt can call and give verbal permission.

## 2017-03-07 NOTE — Progress Notes (Signed)
Patient ID: [name], female    DOB: [birth year], 71 y.o.   MRN: [id number]  PCP: Harrison Mons, PA-C  Chief Complaint  Patient presents with  . Hypertension  . Diabetes  . Hyperlipidemia  . Hypothyroidism  . Follow-up  . Medication Refill    Albuterol inhaler    Subjective:   Presents for evaluation of diabetes, HTN, hyperlipidemia, hypothyroidism.  In general, she feels well.  Red splotch on the RIGHT outer eyelid x 1 month. Had a similar lesion just inside the hairline that resolved without treatment. No itching, pain, drainage.  Stopped glipizide due to feeling clammy, nausea and trembly. Saw a commercial about side effects of metformin, and stopped it, though she was not having any of the listed effects. Figures it is unsafe, or there wouldn't be commercials like that.  Not yet ready to quit smoking. Eye exam within the past 2 years with [facility].   Review of Systems  Constitutional: Negative for activity change, appetite change, fatigue and unexpected weight change.  HENT: Negative for congestion, dental problem, ear pain, hearing loss, mouth sores, postnasal drip, rhinorrhea, sneezing, sore throat, tinnitus and trouble swallowing.   Eyes: Negative for photophobia, pain, redness and visual disturbance.  Respiratory: Negative for cough, chest tightness and shortness of breath.   Cardiovascular: Negative for chest pain, palpitations and leg swelling.  Gastrointestinal: Negative for abdominal pain, blood in stool, constipation, diarrhea, nausea and vomiting.  Endocrine: Negative for cold intolerance, heat intolerance, polydipsia, polyphagia and polyuria.  Genitourinary: Negative for dysuria, frequency, hematuria and urgency.  Musculoskeletal: Negative for arthralgias, gait problem, myalgias and neck stiffness.  Skin: Negative for rash.       Lesion RIGHT eyelid  Neurological: Negative for dizziness, speech difficulty, weakness, light-headedness,  numbness and headaches.  Hematological: Negative for adenopathy.  Psychiatric/Behavioral: Negative for confusion and sleep disturbance. The patient is not nervous/anxious.        Patient Active Problem List   Diagnosis Date Noted  . Smoker 07/08/2014  . Seasonal allergies 07/08/2014  . Atherosclerosis of aorta ([place]) 06/05/2014  . Atherosclerosis of artery 06/05/2014  . Hemorrhagic cystitis 05/17/2014  . HTN (hypertension) 08/14/2012  . Hyperlipidemia 08/14/2012  . Hypothyroidism 08/14/2012  . GERD (gastroesophageal reflux disease) 08/14/2012  . Lingular mass 08/14/2012  . Diabetes mellitus type 2, uncomplicated ([place]) 53/61/4431  . Edema   . Low bone mass   . History of colon polyps   . Vitamin D insufficiency   . Nephrolithiasis   . Diverticulosis      Prior to Admission medications   Medication Sig Start Date End Date Taking? Authorizing Provider  albuterol (PROVENTIL HFA;VENTOLIN HFA) 108 (90 Base) MCG/ACT inhaler Inhale 2 puffs into the lungs every 4 (four) hours as needed for wheezing or shortness of breath. 07/27/16  Yes Rami Waddle, PA-C  aspirin 81 MG tablet Take 1 tablet (81 mg total) by mouth daily. 08/03/15  Yes Shawn Carattini, PA-C  atorvastatin (LIPITOR) 40 MG tablet Take 1 tablet (40 mg total) by mouth daily. 07/27/16  Yes Malyn Aytes, PA-C  hydrochlorothiazide (HYDRODIURIL) 25 MG tablet Take 1 tablet (25 mg total) by mouth every morning. 07/27/16  Yes Evanee Lubrano, PA-C  levothyroxine (SYNTHROID, LEVOTHROID) 88 MCG tablet Take 1 tablet (88 mcg total) by mouth daily. 07/27/16  Yes Cyril Railey, PA-C  pantoprazole (PROTONIX) 40 MG tablet Take 1 tablet (40 mg total) by mouth daily. 07/27/16  Yes Maison Agrusa, PA-C  sodium chloride (OCEAN) 0.65 %  SOLN nasal spray Place 1 spray into both nostrils as needed for congestion.   Yes [provider]     Allergies  Allergen Reactions  . Actonel [Risedronate Sodium] Other (See Comments)    Chest  Pain/Severe GERD       Objective:  Physical Exam  Constitutional: She is oriented to person, place, and time. She appears well-developed and well-nourished. No distress.  BP 122/74 (BP Location: Left Arm, Patient Position: Sitting, Cuff Size: Normal)   Pulse 88   Temp 98.7 F (37.1 C) (Oral)   Resp 18   Ht 5' 2.75" (1.594 m)   Wt 165 lb 12.8 oz (75.2 kg)   SpO2 92%   BMI 29.60 kg/m    Eyes: Conjunctivae are normal. No scleral icterus.  Neck: No thyromegaly present.  Cardiovascular: Normal rate, regular rhythm, normal heart sounds and intact distal pulses.  Pulmonary/Chest: Effort normal and breath sounds normal.  Lymphadenopathy:    She has no cervical adenopathy.  Neurological: She is alert and oriented to person, place, and time.  Skin: Skin is warm and dry. Lesion (LEFT upper lid, lateral brow. Irregular shaped erythematous patch with slight raised border) noted. No rash noted.  Psychiatric: She has a normal mood and affect. Her speech is normal and behavior is normal.           Assessment & Plan:   Problem List Items Addressed This Visit    HTN (hypertension) (Chronic)    Borderline control. Continue current treatment. Monitor.      Relevant Orders   CBC with Differential/Platelet (Completed)   Comprehensive metabolic panel (Completed)   Hyperlipidemia (Chronic)    Update labs. Goal LDL <70.      Relevant Orders   Comprehensive metabolic panel (Completed)   Lipid panel (Completed)   Hypothyroidism (Chronic)    Clinically euthyroid. Await labs.      Relevant Orders   TSH (Completed)   T4, free (Completed)   Vitamin D insufficiency    Update lab.      Relevant Orders   VITAMIN D 25 Hydroxy (Vit-D Deficiency, Fractures) (Completed)   Diabetes mellitus type 2, uncomplicated ([place]) - Primary    Await lab results. Counseled on treatments and safety issues related to metformin.      Relevant Orders   Comprehensive metabolic panel (Completed)    Hemoglobin A1c (Completed)   Smoker    Again encouraged smoking cessation.      Relevant Medications   albuterol (PROVENTIL HFA;VENTOLIN HFA) 108 (90 Base) MCG/ACT inhaler    Other Visit Diagnoses    Need for influenza vaccination       Relevant Orders   Flu Vaccine QUAD 36+ mos IM (Completed)       Return in about 3 months (around 06/07/2017) for re-evaluation of diabetes, blood pressure, cholesterol.   Fara Chute, PA-C [facility] [place]

## 2017-03-07 NOTE — Telephone Encounter (Signed)
Please call [facility] to get documentation of patient's most recent eye exam.

## 2017-03-07 NOTE — Patient Instructions (Signed)
     IF you received an x-ray today, you will receive an invoice from [facility]. Please contact [facility] at [phone] with questions or concerns regarding your invoice.   IF you received labwork today, you will receive an invoice from [facility]. Please contact [facility] at [phone] with questions or concerns regarding your invoice.   Our billing staff will not be able to assist you with questions regarding bills from these companies.  You will be contacted with the lab results as soon as they are available. The fastest way to get your results is to activate your My Chart account. Instructions are located on the last page of this paperwork. If you have not heard from us regarding the results in 2 weeks, please contact this office.     

## 2017-03-08 LAB — LIPID PANEL
CHOL/HDL RATIO: 3.4 ratio (ref 0.0–4.4)
Cholesterol, Total: 152 mg/dL (ref 100–199)
HDL: 45 mg/dL (ref >39)
LDL Calculated: 82 mg/dL (ref 0–99)
Triglycerides: 125 mg/dL (ref 0–149)
VLDL Cholesterol Cal: 25 mg/dL (ref 5–40)

## 2017-03-08 LAB — CBC WITH DIFFERENTIAL/PLATELET
BASOS ABS: 0 10*3/uL (ref 0.0–0.2)
BASOS: 0 %
EOS (ABSOLUTE): 0.1 10*3/uL (ref 0.0–0.4)
Eos: 2 %
HEMATOCRIT: 45.9 % (ref 34.0–46.6)
HEMOGLOBIN: 15.4 g/dL (ref 11.1–15.9)
Immature Grans (Abs): 0 10*3/uL (ref 0.0–0.1)
Immature Granulocytes: 0 %
LYMPHS ABS: 1.4 10*3/uL (ref 0.7–3.1)
Lymphs: 16 %
MCH: 31.1 pg (ref 26.6–33.0)
MCHC: 33.6 g/dL (ref 31.5–35.7)
MCV: 93 fL (ref 79–97)
MONOCYTES: 6 %
Monocytes Absolute: 0.5 10*3/uL (ref 0.1–0.9)
NEUTROS ABS: 6.3 10*3/uL (ref 1.4–7.0)
Neutrophils: 76 %
Platelets: 242 10*3/uL (ref 150–379)
RBC: 4.95 x10E6/uL (ref 3.77–5.28)
RDW: 14.2 % (ref 12.3–15.4)
WBC: 8.3 10*3/uL (ref 3.4–10.8)

## 2017-03-08 LAB — COMPREHENSIVE METABOLIC PANEL
A/G RATIO: 1.7 (ref 1.2–2.2)
ALBUMIN: 4.4 g/dL (ref 3.5–4.8)
ALK PHOS: 112 IU/L (ref 39–117)
ALT: 24 IU/L (ref 0–32)
AST: 18 IU/L (ref 0–40)
BILIRUBIN TOTAL: 0.6 mg/dL (ref 0.0–1.2)
BUN / CREAT RATIO: 11 — AB (ref 12–28)
BUN: 12 mg/dL (ref 8–27)
CHLORIDE: 100 mmol/L (ref 96–106)
CO2: 21 mmol/L (ref 20–29)
Calcium: 9.4 mg/dL (ref 8.7–10.3)
Creatinine, Ser: 1.05 mg/dL — ABNORMAL HIGH (ref 0.57–1.00)
GFR calc non Af Amer: 53 mL/min/{1.73_m2} — ABNORMAL LOW (ref >59)
GFR, EST AFRICAN AMERICAN: 61 mL/min/{1.73_m2} (ref >59)
GLOBULIN, TOTAL: 2.6 g/dL (ref 1.5–4.5)
GLUCOSE: 182 mg/dL — AB (ref 65–99)
POTASSIUM: 4 mmol/L (ref 3.5–5.2)
SODIUM: 140 mmol/L (ref 134–144)
TOTAL PROTEIN: 7 g/dL (ref 6.0–8.5)

## 2017-03-08 LAB — HEMOGLOBIN A1C
Est. average glucose Bld gHb Est-mCnc: 171 mg/dL
HEMOGLOBIN A1C: 7.6 % — AB (ref 4.8–5.6)

## 2017-03-08 LAB — T4, FREE: Free T4: 1.78 ng/dL — ABNORMAL HIGH (ref 0.82–1.77)

## 2017-03-08 LAB — TSH: TSH: 5.26 u[IU]/mL — AB (ref 0.450–4.500)

## 2017-03-08 LAB — VITAMIN D 25 HYDROXY (VIT D DEFICIENCY, FRACTURES): VIT D 25 HYDROXY: 26.1 ng/mL — AB (ref 30.0–100.0)

## 2017-03-12 NOTE — Assessment & Plan Note (Signed)
Update lab 

## 2017-03-12 NOTE — Assessment & Plan Note (Signed)
Update labs. Goal LDL <70.

## 2017-03-12 NOTE — Assessment & Plan Note (Addendum)
Controlled. Continue current treatment. 

## 2017-03-12 NOTE — Assessment & Plan Note (Signed)
Await lab results. Counseled on treatments and safety issues related to metformin.

## 2017-03-12 NOTE — Assessment & Plan Note (Signed)
Again encouraged smoking cessation 

## 2017-03-12 NOTE — Assessment & Plan Note (Signed)
Clinically euthyroid. Await labs.

## 2017-03-22 NOTE — Telephone Encounter (Signed)
Please follow up on this

## 2017-04-17 ENCOUNTER — Telehealth: Payer: Self-pay | Admitting: Physician Assistant

## 2017-04-17 NOTE — Telephone Encounter (Signed)
Patient called an requested metformin ordered, last OV 03/07/17 mentioned about Metformin.

## 2017-04-17 NOTE — Telephone Encounter (Signed)
Copied from [place] [phone]. Topic: Quick Communication - Rx Refill/Question >> Apr 17, 2017  1:46 PM Robina Ade, Helene Kelp D wrote: Has the patient contacted their pharmacy?No (Agent: If no, request that the patient contact the pharmacy for the refill.) Preferred Pharmacy (with phone number or street name): [facility], [place]: Please be advised that RX refills may take up to 3 business days. We ask that you follow-up with your pharmacy. Patient is requesting metformin, 500 mg twice each day for 3 month supply. Per provider she needs to take this.

## 2017-04-18 MED ORDER — METFORMIN HCL 500 MG PO TABS: 500.0000 mg | ORAL_TABLET | Freq: Two times a day (BID) | ORAL | 3 refills | Status: AC

## 2017-04-18 NOTE — Telephone Encounter (Signed)
Chelle,  I didn't see this in the office note.

## 2017-04-18 NOTE — Telephone Encounter (Signed)
It's in the lab result note from November, her A1C was up and I recommended that she resume the metformin, and to contact me if she needed a refill.  Meds ordered this encounter  Medications  . metFORMIN (GLUCOPHAGE) 500 MG tablet    Sig: Take 1 tablet (500 mg total) by mouth 2 (two) times daily with a meal.    Dispense:  180 tablet    Refill:  3    Order Specific Question:   Supervising Provider    Answer:   Brigitte Pulse, EVA N [[id number]]

## 2017-05-23 ENCOUNTER — Other Ambulatory Visit: Payer: Self-pay | Admitting: Physician Assistant

## 2017-05-23 DIAGNOSIS — E785 Hyperlipidemia, unspecified: Secondary | ICD-10-CM

## 2017-05-23 DIAGNOSIS — K219 Gastro-esophageal reflux disease without esophagitis: Secondary | ICD-10-CM

## 2017-05-23 DIAGNOSIS — I10 Essential (primary) hypertension: Secondary | ICD-10-CM

## 2017-05-23 DIAGNOSIS — E039 Hypothyroidism, unspecified: Secondary | ICD-10-CM

## 2017-06-09 ENCOUNTER — Other Ambulatory Visit: Payer: Self-pay

## 2017-06-09 ENCOUNTER — Encounter: Payer: Self-pay | Admitting: Physician Assistant

## 2017-06-09 ENCOUNTER — Ambulatory Visit (INDEPENDENT_AMBULATORY_CARE_PROVIDER_SITE_OTHER): Payer: Medicare HMO | Admitting: Physician Assistant

## 2017-06-09 VITALS — BP 122/60 | HR 88 | Temp 98.6°F | Resp 16 | Ht 62.75 in | Wt 163.0 lb

## 2017-06-09 DIAGNOSIS — I10 Essential (primary) hypertension: Secondary | ICD-10-CM

## 2017-06-09 DIAGNOSIS — E785 Hyperlipidemia, unspecified: Secondary | ICD-10-CM | POA: Diagnosis not present

## 2017-06-09 DIAGNOSIS — E119 Type 2 diabetes mellitus without complications: Secondary | ICD-10-CM

## 2017-06-09 DIAGNOSIS — F17200 Nicotine dependence, unspecified, uncomplicated: Secondary | ICD-10-CM | POA: Diagnosis not present

## 2017-06-09 DIAGNOSIS — I700 Atherosclerosis of aorta: Secondary | ICD-10-CM

## 2017-06-09 DIAGNOSIS — E039 Hypothyroidism, unspecified: Secondary | ICD-10-CM

## 2017-06-09 DIAGNOSIS — Z1231 Encounter for screening mammogram for malignant neoplasm of breast: Secondary | ICD-10-CM | POA: Diagnosis not present

## 2017-06-09 NOTE — Patient Instructions (Addendum)
Keep working on quitting smoking. I'd like you to exercise for 150 minutes/week. Start with something-5 minutes per day, or 20 minutes twice a week. Add on as you are able. Walking in place while you are watching TV can count!    IF you received an x-ray today, you will receive an invoice from [facility]. Please contact [facility] at [phone] with questions or concerns regarding your invoice.   IF you received labwork today, you will receive an invoice from [facility]. Please contact [facility] at [phone] with questions or concerns regarding your invoice.   Our billing staff will not be able to assist you with questions regarding bills from these companies.  You will be contacted with the lab results as soon as they are available. The fastest way to get your results is to activate your My Chart account. Instructions are located on the last page of this paperwork. If you have not heard from [place] regarding the results in 2 weeks, please contact this office.

## 2017-06-09 NOTE — Assessment & Plan Note (Signed)
Well controlled 

## 2017-06-09 NOTE — Assessment & Plan Note (Signed)
Await labs. Adjust regimen as indicated by results.  

## 2017-06-09 NOTE — Assessment & Plan Note (Signed)
Encouraged smoking cessation 

## 2017-06-09 NOTE — Assessment & Plan Note (Signed)
Await labs. Adjust regimen as indicated by results. Goal LDL <70. Increase physical activity.

## 2017-06-09 NOTE — Progress Notes (Signed)
Patient ID: Erika Mason, female    DOB: 01-08-1946, 72 y.o.   MRN: [id number]  PCP: Harrison Mons, PA-C  Chief Complaint  Patient presents with  . Hypertension    follow up  . Hyperlipidemia    follow up   . Diabetes    follow up     Subjective:   Presents for evaluation of diabetes, HTN and hyperlipidemia.  Last labs 02/2017: A1C 7.6%, up from 6.7% LDL 82, up from 76, up from 68 TSH 5.2, T4 1.78 Creatinine 1.05, GFR 53  Generally feels well, without any specific complaints.  Feels her heart skip, each minute x 4-5 minutes, evenings, semi-reclined, watching TV May occur after using albuterol before bed. Used to happen more often, then stopped, has recurred. No associated CP, SOB, nausea, dizziness, neck/jaw pain.  Continues to smoke 1 ppd, can sometimes reduce to 6/day. Tried another e-cig, but it was too strong. Is intermittently motivated to quit. Some cough. Notes easier breathing after coughing up phlegm, but doesn't feel SOB before.  No intentional exercise. Housework, walking in the stores. Occasionally does sit ups in her bed. Has Silver Sneakers, but hasn't utilized. Feels safe inside her home.   Review of Systems  Constitutional: Negative for activity change, appetite change, fatigue and unexpected weight change.  HENT: Negative for congestion, dental problem, ear pain, hearing loss, mouth sores, postnasal drip, rhinorrhea, sneezing, sore throat, tinnitus and trouble swallowing.   Eyes: Negative for photophobia, pain, redness and visual disturbance.  Respiratory: Positive for cough. Negative for chest tightness, shortness of breath and wheezing.   Cardiovascular: Positive for palpitations. Negative for chest pain and leg swelling.  Gastrointestinal: Negative for abdominal pain, blood in stool, constipation, diarrhea, nausea and vomiting.  Endocrine: Negative for cold intolerance, heat intolerance, polydipsia, polyphagia and polyuria.    Genitourinary: Negative for dysuria, frequency, hematuria and urgency.  Musculoskeletal: Negative for arthralgias, gait problem, myalgias and neck stiffness.  Skin: Negative for rash.  Neurological: Negative for dizziness, speech difficulty, weakness, light-headedness, numbness and headaches.  Hematological: Negative for adenopathy.  Psychiatric/Behavioral: Negative for confusion, dysphoric mood and sleep disturbance. The patient is not nervous/anxious.     Patient Active Problem List   Diagnosis Date Noted  . Smoker 07/08/2014  . Seasonal allergies 07/08/2014  . Atherosclerosis of aorta ([place]) 06/05/2014  . Atherosclerosis of artery 06/05/2014  . Hemorrhagic cystitis 05/17/2014  . HTN (hypertension) 08/14/2012  . Hyperlipidemia 08/14/2012  . Hypothyroidism 08/14/2012  . GERD (gastroesophageal reflux disease) 08/14/2012  . Lingular mass 08/14/2012  . Diabetes mellitus type 2, uncomplicated ([place]) 93/81/0175  . Edema   . Low bone mass   . History of colon polyps   . Vitamin D insufficiency   . Nephrolithiasis   . Diverticulosis      Prior to Admission medications   Medication Sig Start Date End Date Taking? Authorizing Provider  albuterol (PROVENTIL HFA;VENTOLIN HFA) 108 (90 Base) MCG/ACT inhaler Inhale 2 puffs into the lungs every 4 (four) hours as needed for wheezing or shortness of breath. 03/07/17  Yes Tesia Lybrand, PA-C  aspirin 81 MG tablet Take 1 tablet (81 mg total) by mouth daily. 08/03/15  Yes Nerissa Constantin, PA-C  atorvastatin (LIPITOR) 40 MG tablet TAKE 1 TABLET EVERY DAY 05/24/17  Yes Neeko Pharo, PA-C  hydrochlorothiazide (HYDRODIURIL) 25 MG tablet TAKE 1 TABLET EVERY MORNING 05/24/17  Yes Marvelle Span, PA-C  levothyroxine (SYNTHROID, LEVOTHROID) 88 MCG tablet TAKE 1 TABLET EVERY DAY 05/24/17  Yes Jacqulynn Cadet,  Gregori Abril, PA-C  metFORMIN (GLUCOPHAGE) 500 MG tablet Take 1 tablet (500 mg total) by mouth 2 (two) times daily with a meal. 04/18/17  Yes Greyden Besecker, PA-C   pantoprazole (PROTONIX) 40 MG tablet TAKE 1 TABLET EVERY DAY 05/24/17  Yes Disney Ruggiero, PA-C  sodium chloride (OCEAN) 0.65 % SOLN nasal spray Place 1 spray into both nostrils as needed for congestion.   Yes [provider]     Allergies  Allergen Reactions  . Actonel [Risedronate Sodium] Other (See Comments)    Chest Pain/Severe GERD       Objective:  Physical Exam  Constitutional: She is oriented to person, place, and time. She appears well-developed and well-nourished. No distress.  BP 122/60   Pulse 88   Temp 98.6 F (37 C)   Resp 16   Ht 5' 2.75" (1.594 m)   Wt 163 lb (73.9 kg)   SpO2 96%   BMI 29.10 kg/m    Eyes: Conjunctivae are normal. No scleral icterus.  Neck: No thyromegaly present.  Cardiovascular: Normal rate, regular rhythm, normal heart sounds and intact distal pulses.  Pulmonary/Chest: Effort normal and breath sounds normal.  Lymphadenopathy:    She has no cervical adenopathy.  Neurological: She is alert and oriented to person, place, and time.  Skin: Skin is warm and dry.  Psychiatric: She has a normal mood and affect. Her speech is normal and behavior is normal.    Wt Readings from Last 3 Encounters:  06/09/17 163 lb (73.9 kg)  03/07/17 165 lb 12.8 oz (75.2 kg)  11/29/16 165 lb 12.8 oz (75.2 kg)       Assessment & Plan:   Problem List Items Addressed This Visit    HTN (hypertension) (Chronic)    Well controlled.      Relevant Orders   Comprehensive metabolic panel   CBC with Differential/Platelet   Hyperlipidemia (Chronic)    Await labs. Adjust regimen as indicated by results. Goal LDL <70. Increase physical activity.      Relevant Orders   Comprehensive metabolic panel   Lipid panel   Hypothyroidism (Chronic)    Await labs. Adjust regimen as indicated by results.      Relevant Orders   TSH   T4, free   Diabetes mellitus type 2, uncomplicated ([place]) - Primary    Await labs. Adjust regimen as indicated by results.  Goal A1C <7%. Increase physical activity.      Relevant Orders   Comprehensive metabolic panel   Hemoglobin A1c   Lipid panel   Atherosclerosis of aorta ([facility])    Continue efforts to control blood pressure, DM and lipids. Encouraged smoking cessation. Increase physical activity.      Relevant Orders   Lipid panel   Smoker    Encouraged smoking cessation.       Other Visit Diagnoses    Encounter for screening mammogram for breast cancer       Relevant Orders   MM DIGITAL SCREENING BILATERAL       Return in about 3 months (around 09/09/2017) for re-evaluation of diabetes, blood pressure, cholesterol and thyroid.   Fara Chute, PA-C [facility] [place]

## 2017-06-09 NOTE — Assessment & Plan Note (Signed)
Await labs. Adjust regimen as indicated by results. Goal A1C <7%. Increase physical activity.

## 2017-06-09 NOTE — Assessment & Plan Note (Signed)
Continue efforts to control blood pressure, DM and lipids. Encouraged smoking cessation. Increase physical activity.

## 2017-06-10 LAB — CBC WITH DIFFERENTIAL/PLATELET
BASOS ABS: 0 10*3/uL (ref 0.0–0.2)
Basos: 1 %
EOS (ABSOLUTE): 0.2 10*3/uL (ref 0.0–0.4)
Eos: 3 %
Hematocrit: 44.3 % (ref 34.0–46.6)
Hemoglobin: 15 g/dL (ref 11.1–15.9)
Immature Grans (Abs): 0 10*3/uL (ref 0.0–0.1)
Immature Granulocytes: 0 %
LYMPHS: 19 %
Lymphocytes Absolute: 1.7 10*3/uL (ref 0.7–3.1)
MCH: 31.5 pg (ref 26.6–33.0)
MCHC: 33.9 g/dL (ref 31.5–35.7)
MCV: 93 fL (ref 79–97)
Monocytes Absolute: 0.6 10*3/uL (ref 0.1–0.9)
Monocytes: 6 %
NEUTROS ABS: 6.3 10*3/uL (ref 1.4–7.0)
NEUTROS PCT: 71 %
PLATELETS: 263 10*3/uL (ref 150–379)
RBC: 4.76 x10E6/uL (ref 3.77–5.28)
RDW: 13.9 % (ref 12.3–15.4)
WBC: 8.9 10*3/uL (ref 3.4–10.8)

## 2017-06-10 LAB — TSH: TSH: 3.85 u[IU]/mL (ref 0.450–4.500)

## 2017-06-10 LAB — COMPREHENSIVE METABOLIC PANEL
A/G RATIO: 1.9 (ref 1.2–2.2)
ALBUMIN: 4.4 g/dL (ref 3.5–4.8)
ALK PHOS: 89 IU/L (ref 39–117)
ALT: 32 IU/L (ref 0–32)
AST: 22 IU/L (ref 0–40)
BILIRUBIN TOTAL: 0.6 mg/dL (ref 0.0–1.2)
BUN/Creatinine Ratio: 13 (ref 12–28)
BUN: 13 mg/dL (ref 8–27)
CHLORIDE: 98 mmol/L (ref 96–106)
CO2: 26 mmol/L (ref 20–29)
Calcium: 9.7 mg/dL (ref 8.7–10.3)
Creatinine, Ser: 1 mg/dL (ref 0.57–1.00)
GFR calc non Af Amer: 56 mL/min/{1.73_m2} — ABNORMAL LOW (ref >59)
GFR, EST AFRICAN AMERICAN: 65 mL/min/{1.73_m2} (ref >59)
GLUCOSE: 145 mg/dL — AB (ref 65–99)
Globulin, Total: 2.3 g/dL (ref 1.5–4.5)
POTASSIUM: 4.8 mmol/L (ref 3.5–5.2)
Sodium: 140 mmol/L (ref 134–144)
TOTAL PROTEIN: 6.7 g/dL (ref 6.0–8.5)

## 2017-06-10 LAB — LIPID PANEL
CHOL/HDL RATIO: 2.8 ratio (ref 0.0–4.4)
Cholesterol, Total: 122 mg/dL (ref 100–199)
HDL: 43 mg/dL (ref >39)
LDL Calculated: 62 mg/dL (ref 0–99)
TRIGLYCERIDES: 85 mg/dL (ref 0–149)
VLDL Cholesterol Cal: 17 mg/dL (ref 5–40)

## 2017-06-10 LAB — HEMOGLOBIN A1C
Est. average glucose Bld gHb Est-mCnc: 148 mg/dL
HEMOGLOBIN A1C: 6.8 % — AB (ref 4.8–5.6)

## 2017-06-10 LAB — T4, FREE: FREE T4: 1.84 ng/dL — AB (ref 0.82–1.77)

## 2017-07-17 ENCOUNTER — Encounter: Payer: Self-pay | Admitting: Physician Assistant

## 2017-07-28 ENCOUNTER — Ambulatory Visit
Admission: RE | Admit: 2017-07-28 | Discharge: 2017-07-28 | Disposition: A | Discharge status: Home / Self Care | Payer: Commercial Managed Care - HMO | Source: Ambulatory Visit | Attending: Physician Assistant | Admitting: Physician Assistant

## 2017-07-28 DIAGNOSIS — Z1231 Encounter for screening mammogram for malignant neoplasm of breast: Secondary | ICD-10-CM | POA: Diagnosis not present

## 2017-09-06 ENCOUNTER — Encounter: Payer: Self-pay | Admitting: Family Medicine

## 2017-09-12 ENCOUNTER — Other Ambulatory Visit: Payer: Self-pay

## 2017-09-12 ENCOUNTER — Ambulatory Visit (INDEPENDENT_AMBULATORY_CARE_PROVIDER_SITE_OTHER): Payer: Medicare HMO | Admitting: Physician Assistant

## 2017-09-12 ENCOUNTER — Encounter: Payer: Self-pay | Admitting: Physician Assistant

## 2017-09-12 VITALS — BP 128/72 | HR 94 | Temp 98.8°F | Resp 16 | Ht 62.0 in | Wt 159.2 lb

## 2017-09-12 DIAGNOSIS — Z794 Long term (current) use of insulin: Secondary | ICD-10-CM

## 2017-09-12 DIAGNOSIS — E559 Vitamin D deficiency, unspecified: Secondary | ICD-10-CM | POA: Diagnosis not present

## 2017-09-12 DIAGNOSIS — E039 Hypothyroidism, unspecified: Secondary | ICD-10-CM

## 2017-09-12 DIAGNOSIS — F17200 Nicotine dependence, unspecified, uncomplicated: Secondary | ICD-10-CM | POA: Diagnosis not present

## 2017-09-12 DIAGNOSIS — I10 Essential (primary) hypertension: Secondary | ICD-10-CM | POA: Diagnosis not present

## 2017-09-12 DIAGNOSIS — E785 Hyperlipidemia, unspecified: Secondary | ICD-10-CM | POA: Diagnosis not present

## 2017-09-12 DIAGNOSIS — E119 Type 2 diabetes mellitus without complications: Secondary | ICD-10-CM | POA: Diagnosis not present

## 2017-09-12 MED ORDER — ALBUTEROL SULFATE HFA 108 (90 BASE) MCG/ACT IN AERS: 2.0000 | INHALATION_SPRAY | RESPIRATORY_TRACT | 1 refills | Status: AC | PRN

## 2017-09-12 NOTE — Assessment & Plan Note (Signed)
Has been controlled. Await lab results and adjust dose if indicated.

## 2017-09-12 NOTE — Assessment & Plan Note (Signed)
Goal LDL <70. Await lab results.

## 2017-09-12 NOTE — Assessment & Plan Note (Signed)
Has been controlled. Await lab results. If A1C >7%, plan increase metformin to 1000 mg BID.

## 2017-09-12 NOTE — Assessment & Plan Note (Signed)
Not ready to quit smoking. Encouraged.

## 2017-09-12 NOTE — Assessment & Plan Note (Signed)
Well-controlled.  Continue current regimen. 

## 2017-09-12 NOTE — Patient Instructions (Addendum)
If you choose to follow me, Go ahead and call [facility] to schedule your next visit with me there. [phone].   IF you received an x-ray today, you will receive an invoice from [facility]. Please contact [facility] at [phone] with questions or concerns regarding your invoice.   IF you received labwork today, you will receive an invoice from [facility]. Please contact [facility] at [phone] with questions or concerns regarding your invoice.   Our billing staff will not be able to assist you with questions regarding bills from these companies.  You will be contacted with the lab results as soon as they are available. The fastest way to get your results is to activate your My Chart account. Instructions are located on the last page of this paperwork. If you have not heard from [place] regarding the results in 2 weeks, please contact this office.    Did you know that you begin to benefit from quitting smoking within the first twenty minutes? It's TRUE.  At 20 minutes: -blood pressure decreases -pulse rate drops -body temperature of hands and feet increases  At 8 hours: -carbon monoxide level in blood drops to normal -oxygen level in blood increases to normal  At 24 hours: -the chance of heart attack decreases  At 48 hours: -nerve endings start regrowing -ability to smell and taste is enhanced  2 weeks-3 months: -circulation improves -walking becomes easier -lung function improves  1-9 months: -coughing, sinus congestion, fatigue and shortness of breath decreases  1 year: -excess risk of heart disease is decreased to HALF that of a smoker  5 years: Stroke risk is reduced to that of people who have never smoked  10 years: -risk of lung cancer drops to as little as half that of continuing smokers -risk of cancer of the mouth, throat, esophagus, bladder, kidney and pancreas decreases -risk of ulcer decreases  15 years -risk of heart  disease is now similar to that of people who have never smoked -risk of death returns to nearly the level of people who have never smoked

## 2017-09-12 NOTE — Progress Notes (Signed)
Patient ID: Erika Mason, female    DOB: 01/03/1946, 72 y.o.   MRN: [id number]  PCP: Harrison Mons, PA-C  Chief Complaint  Patient presents with  . Hyperlipidemia    follow up   . Diabetes    follow up   . Hypertension    follow up     Subjective:   Presents for evaluation of diabetes, HTN and hyperlipidemia.  Feels well. No problems or concerns. No CP, SOB, HA, Dizziness. Occasional stress incontinence. No ready to quit smoking. Tolerating her medications without difficulty.  Last labs 06/2017. TSH normal, but T4 remains slightly elevated. A1C 6.8%, down from 7.6% in 02/2017 LDL 62  Review of Systems  Constitutional: Negative for activity change, appetite change, fatigue and unexpected weight change.  HENT: Negative for congestion, dental problem, ear pain, hearing loss, mouth sores, postnasal drip, rhinorrhea, sneezing, sore throat, tinnitus and trouble swallowing.   Eyes: Negative for photophobia, pain, redness and visual disturbance.  Respiratory: Negative for cough, chest tightness and shortness of breath.   Cardiovascular: Negative for chest pain, palpitations and leg swelling.  Gastrointestinal: Negative for abdominal pain, blood in stool, constipation, diarrhea, nausea and vomiting.  Endocrine: Negative for cold intolerance, heat intolerance, polydipsia, polyphagia and polyuria.  Genitourinary: Negative for dysuria, frequency, hematuria and urgency.  Musculoskeletal: Negative for arthralgias, gait problem, myalgias and neck stiffness.  Skin: Negative for rash.  Neurological: Negative for dizziness, speech difficulty, weakness, light-headedness, numbness and headaches.  Hematological: Negative for adenopathy.  Psychiatric/Behavioral: Negative for confusion and sleep disturbance. The patient is not nervous/anxious.        Patient Active Problem List   Diagnosis Date Noted  . Smoker 07/08/2014  . Seasonal allergies 07/08/2014  . Atherosclerosis  of aorta ([place]) 06/05/2014  . Atherosclerosis of artery 06/05/2014  . Hemorrhagic cystitis 05/17/2014  . HTN (hypertension) 08/14/2012  . Hyperlipidemia 08/14/2012  . Hypothyroidism 08/14/2012  . GERD (gastroesophageal reflux disease) 08/14/2012  . Lingular mass 08/14/2012  . Diabetes mellitus type 2, uncomplicated ([place]) 10/62/6948  . Edema   . Low bone mass   . History of colon polyps   . Vitamin D insufficiency   . Nephrolithiasis   . Diverticulosis      Prior to Admission medications   Medication Sig Start Date End Date Taking? Authorizing Provider  albuterol (PROVENTIL HFA;VENTOLIN HFA) 108 (90 Base) MCG/ACT inhaler Inhale 2 puffs into the lungs every 4 (four) hours as needed for wheezing or shortness of breath. 03/07/17  Yes Airyonna Franklyn, PA-C  aspirin 81 MG tablet Take 1 tablet (81 mg total) by mouth daily. 08/03/15  Yes Cleavon Goldman, PA-C  atorvastatin (LIPITOR) 40 MG tablet TAKE 1 TABLET EVERY DAY 05/24/17  Yes Jakelin Taussig, PA-C  hydrochlorothiazide (HYDRODIURIL) 25 MG tablet TAKE 1 TABLET EVERY MORNING 05/24/17  Yes Tashari Schoenfelder, PA-C  levothyroxine (SYNTHROID, LEVOTHROID) 88 MCG tablet TAKE 1 TABLET EVERY DAY 05/24/17  Yes Myda Detwiler, PA-C  metFORMIN (GLUCOPHAGE) 500 MG tablet Take 1 tablet (500 mg total) by mouth 2 (two) times daily with a meal. 04/18/17  Yes Nyeemah Jennette, PA-C  pantoprazole (PROTONIX) 40 MG tablet TAKE 1 TABLET EVERY DAY 05/24/17  Yes Mia Milan, PA-C  sodium chloride (OCEAN) 0.65 % SOLN nasal spray Place 1 spray into both nostrils as needed for congestion.   Yes [provider]     Allergies  Allergen Reactions  . Actonel [Risedronate Sodium] Other (See Comments)    Chest Pain/Severe GERD  Objective:  Physical Exam  Constitutional: She is oriented to person, place, and time. She appears well-developed and well-nourished. No distress.  BP 128/72   Pulse 94   Temp 98.8 F (37.1 C)   Resp 16   Ht 5\' 2"  (1.575 m)    Wt 159 lb 3.2 oz (72.2 kg)   SpO2 96%   BMI 29.12 kg/m    Eyes: Conjunctivae are normal. No scleral icterus.  Neck: No thyromegaly present.  Cardiovascular: Normal rate, regular rhythm, normal heart sounds and intact distal pulses.  Pulmonary/Chest: Effort normal and breath sounds normal.  Lymphadenopathy:    She has no cervical adenopathy.  Neurological: She is alert and oriented to person, place, and time.  Skin: Skin is warm and dry.  Psychiatric: She has a normal mood and affect. Her speech is normal and behavior is normal.    Diabetic Foot Exam - Simple   Simple Foot Form Diabetic Foot exam was performed with the following findings:  Yes 09/12/2017 10:13 AM  Visual Inspection No deformities, no ulcerations, no other skin breakdown bilaterally:  Yes Sensation Testing Intact to touch and monofilament testing bilaterally:  Yes Pulse Check Posterior Tibialis and Dorsalis pulse intact bilaterally:  Yes Comments         Assessment & Plan:   Problem List Items Addressed This Visit    Vitamin D insufficiency    Update lab. May need to resume supplement.      Relevant Orders   VITAMIN D 25 Hydroxy (Vit-D Deficiency, Fractures)   Smoker    Not ready to quit smoking. Encouraged.      Relevant Medications   albuterol (PROVENTIL HFA;VENTOLIN HFA) 108 (90 Base) MCG/ACT inhaler   Hypothyroidism (Chronic)    Has been controlled. Await lab results and adjust dose if indicated.      Relevant Orders   TSH   T4, free   Hyperlipidemia (Chronic)    Goal LDL <70. Await lab results.      Relevant Orders   Lipid panel   HTN (hypertension) (Chronic)    Well controlled. Continue current regimen.      Relevant Orders   CBC with Differential/Platelet   Comprehensive metabolic panel   Diabetes mellitus type 2, uncomplicated ([place]) - Primary    Has been controlled. Await lab results. If A1C >7%, plan increase metformin to 1000 mg BID.      Relevant Orders   HM DIABETES  FOOT EXAM (Completed)   [facility] DIABETES EYE EXAM (Completed)   Microalbumin / creatinine urine ratio   Hemoglobin A1c       Return in about 4 months (around 01/12/2018) for re-evaluation of diabetes, blood pressure.   Fara Chute, PA-C [facility] [place]

## 2017-09-12 NOTE — Assessment & Plan Note (Signed)
Update lab. May need to resume supplement.

## 2017-09-13 ENCOUNTER — Encounter: Payer: Self-pay | Admitting: Physician Assistant

## 2017-09-13 LAB — CBC WITH DIFFERENTIAL/PLATELET
BASOS: 0 %
Basophils Absolute: 0 10*3/uL (ref 0.0–0.2)
EOS (ABSOLUTE): 0.2 10*3/uL (ref 0.0–0.4)
Eos: 2 %
HEMOGLOBIN: 15.2 g/dL (ref 11.1–15.9)
Hematocrit: 42.3 % (ref 34.0–46.6)
IMMATURE GRANS (ABS): 0 10*3/uL (ref 0.0–0.1)
IMMATURE GRANULOCYTES: 0 %
LYMPHS: 20 %
Lymphocytes Absolute: 1.9 10*3/uL (ref 0.7–3.1)
MCH: 32.3 pg (ref 26.6–33.0)
MCHC: 35.9 g/dL — ABNORMAL HIGH (ref 31.5–35.7)
MCV: 90 fL (ref 79–97)
Monocytes Absolute: 0.4 10*3/uL (ref 0.1–0.9)
Monocytes: 5 %
NEUTROS PCT: 73 %
Neutrophils Absolute: 6.7 10*3/uL (ref 1.4–7.0)
Platelets: 268 10*3/uL (ref 150–450)
RBC: 4.71 x10E6/uL (ref 3.77–5.28)
RDW: 14.1 % (ref 12.3–15.4)
WBC: 9.2 10*3/uL (ref 3.4–10.8)

## 2017-09-13 LAB — VITAMIN D 25 HYDROXY (VIT D DEFICIENCY, FRACTURES): Vit D, 25-Hydroxy: 33.2 ng/mL (ref 30.0–100.0)

## 2017-09-13 LAB — COMPREHENSIVE METABOLIC PANEL
A/G RATIO: 2 (ref 1.2–2.2)
ALBUMIN: 4.5 g/dL (ref 3.5–4.8)
ALT: 30 IU/L (ref 0–32)
AST: 19 IU/L (ref 0–40)
Alkaline Phosphatase: 99 IU/L (ref 39–117)
BUN/Creatinine Ratio: 11 — ABNORMAL LOW (ref 12–28)
BUN: 11 mg/dL (ref 8–27)
Bilirubin Total: 0.4 mg/dL (ref 0.0–1.2)
CALCIUM: 9.9 mg/dL (ref 8.7–10.3)
CO2: 25 mmol/L (ref 20–29)
Chloride: 96 mmol/L (ref 96–106)
Creatinine, Ser: 1.03 mg/dL — ABNORMAL HIGH (ref 0.57–1.00)
GFR, EST AFRICAN AMERICAN: 63 mL/min/{1.73_m2} (ref >59)
GFR, EST NON AFRICAN AMERICAN: 54 mL/min/{1.73_m2} — AB (ref >59)
GLOBULIN, TOTAL: 2.2 g/dL (ref 1.5–4.5)
Glucose: 132 mg/dL — ABNORMAL HIGH (ref 65–99)
POTASSIUM: 3.9 mmol/L (ref 3.5–5.2)
Sodium: 137 mmol/L (ref 134–144)
TOTAL PROTEIN: 6.7 g/dL (ref 6.0–8.5)

## 2017-09-13 LAB — T4, FREE: Free T4: 1.82 ng/dL — ABNORMAL HIGH (ref 0.82–1.77)

## 2017-09-13 LAB — HEMOGLOBIN A1C
Est. average glucose Bld gHb Est-mCnc: 140 mg/dL
Hgb A1c MFr Bld: 6.5 % — ABNORMAL HIGH (ref 4.8–5.6)

## 2017-09-13 LAB — LIPID PANEL
CHOLESTEROL TOTAL: 126 mg/dL (ref 100–199)
Chol/HDL Ratio: 2.9 ratio (ref 0.0–4.4)
HDL: 44 mg/dL (ref >39)
LDL CALC: 61 mg/dL (ref 0–99)
Triglycerides: 103 mg/dL (ref 0–149)
VLDL CHOLESTEROL CAL: 21 mg/dL (ref 5–40)

## 2017-09-13 LAB — MICROALBUMIN / CREATININE URINE RATIO
Creatinine, Urine: 67.2 mg/dL
Microalb/Creat Ratio: 4.8 mg/g creat (ref 0.0–30.0)
Microalbumin, Urine: 3.2 ug/mL

## 2017-09-13 LAB — TSH: TSH: 2.64 u[IU]/mL (ref 0.450–4.500)

## 2018-01-16 DIAGNOSIS — Z23 Encounter for immunization: Secondary | ICD-10-CM | POA: Diagnosis not present

## 2018-01-16 DIAGNOSIS — E039 Hypothyroidism, unspecified: Secondary | ICD-10-CM | POA: Diagnosis not present

## 2018-01-16 DIAGNOSIS — E1122 Type 2 diabetes mellitus with diabetic chronic kidney disease: Secondary | ICD-10-CM | POA: Diagnosis not present

## 2018-01-16 DIAGNOSIS — E785 Hyperlipidemia, unspecified: Secondary | ICD-10-CM | POA: Diagnosis not present

## 2018-01-16 DIAGNOSIS — L989 Disorder of the skin and subcutaneous tissue, unspecified: Secondary | ICD-10-CM | POA: Diagnosis not present

## 2018-01-16 DIAGNOSIS — I10 Essential (primary) hypertension: Secondary | ICD-10-CM | POA: Diagnosis not present

## 2018-01-16 DIAGNOSIS — N183 Chronic kidney disease, stage 3 (moderate): Secondary | ICD-10-CM | POA: Diagnosis not present

## 2018-05-22 DIAGNOSIS — I129 Hypertensive chronic kidney disease with stage 1 through stage 4 chronic kidney disease, or unspecified chronic kidney disease: Secondary | ICD-10-CM | POA: Diagnosis not present

## 2018-05-22 DIAGNOSIS — E559 Vitamin D deficiency, unspecified: Secondary | ICD-10-CM | POA: Diagnosis not present

## 2018-05-22 DIAGNOSIS — E1122 Type 2 diabetes mellitus with diabetic chronic kidney disease: Secondary | ICD-10-CM | POA: Diagnosis not present

## 2018-05-22 DIAGNOSIS — E039 Hypothyroidism, unspecified: Secondary | ICD-10-CM | POA: Diagnosis not present

## 2018-05-22 DIAGNOSIS — I700 Atherosclerosis of aorta: Secondary | ICD-10-CM | POA: Diagnosis not present

## 2018-05-22 DIAGNOSIS — N183 Chronic kidney disease, stage 3 (moderate): Secondary | ICD-10-CM | POA: Diagnosis not present

## 2018-05-22 DIAGNOSIS — E1169 Type 2 diabetes mellitus with other specified complication: Secondary | ICD-10-CM | POA: Diagnosis not present

## 2018-05-22 DIAGNOSIS — E785 Hyperlipidemia, unspecified: Secondary | ICD-10-CM | POA: Diagnosis not present

## 2018-05-22 DIAGNOSIS — F17200 Nicotine dependence, unspecified, uncomplicated: Secondary | ICD-10-CM | POA: Diagnosis not present

## 2018-08-20 DIAGNOSIS — I129 Hypertensive chronic kidney disease with stage 1 through stage 4 chronic kidney disease, or unspecified chronic kidney disease: Secondary | ICD-10-CM | POA: Diagnosis not present

## 2018-08-20 DIAGNOSIS — Z1231 Encounter for screening mammogram for malignant neoplasm of breast: Secondary | ICD-10-CM | POA: Diagnosis not present

## 2018-08-20 DIAGNOSIS — Z0000 Encounter for general adult medical examination without abnormal findings: Secondary | ICD-10-CM | POA: Diagnosis not present

## 2018-08-20 DIAGNOSIS — N183 Chronic kidney disease, stage 3 (moderate): Secondary | ICD-10-CM | POA: Diagnosis not present

## 2018-08-20 DIAGNOSIS — E039 Hypothyroidism, unspecified: Secondary | ICD-10-CM | POA: Diagnosis not present

## 2018-08-20 DIAGNOSIS — E785 Hyperlipidemia, unspecified: Secondary | ICD-10-CM | POA: Diagnosis not present

## 2018-08-20 DIAGNOSIS — Z780 Asymptomatic menopausal state: Secondary | ICD-10-CM | POA: Diagnosis not present

## 2018-08-20 DIAGNOSIS — E1169 Type 2 diabetes mellitus with other specified complication: Secondary | ICD-10-CM | POA: Diagnosis not present

## 2018-08-20 DIAGNOSIS — E1122 Type 2 diabetes mellitus with diabetic chronic kidney disease: Secondary | ICD-10-CM | POA: Diagnosis not present

## 2018-08-22 ENCOUNTER — Other Ambulatory Visit: Payer: Self-pay | Admitting: Physician Assistant

## 2018-08-22 DIAGNOSIS — Z1231 Encounter for screening mammogram for malignant neoplasm of breast: Secondary | ICD-10-CM

## 2018-11-06 DIAGNOSIS — E119 Type 2 diabetes mellitus without complications: Secondary | ICD-10-CM | POA: Diagnosis not present

## 2018-11-12 DIAGNOSIS — H43813 Vitreous degeneration, bilateral: Secondary | ICD-10-CM | POA: Diagnosis not present

## 2018-11-12 DIAGNOSIS — H4322 Crystalline deposits in vitreous body, left eye: Secondary | ICD-10-CM | POA: Diagnosis not present

## 2018-11-12 DIAGNOSIS — H35372 Puckering of macula, left eye: Secondary | ICD-10-CM | POA: Diagnosis not present

## 2018-11-12 DIAGNOSIS — H35342 Macular cyst, hole, or pseudohole, left eye: Secondary | ICD-10-CM | POA: Diagnosis not present

## 2018-11-26 DIAGNOSIS — E039 Hypothyroidism, unspecified: Secondary | ICD-10-CM | POA: Diagnosis not present

## 2018-11-26 DIAGNOSIS — I129 Hypertensive chronic kidney disease with stage 1 through stage 4 chronic kidney disease, or unspecified chronic kidney disease: Secondary | ICD-10-CM | POA: Diagnosis not present

## 2018-11-26 DIAGNOSIS — E1169 Type 2 diabetes mellitus with other specified complication: Secondary | ICD-10-CM | POA: Diagnosis not present

## 2018-11-26 DIAGNOSIS — N183 Chronic kidney disease, stage 3 (moderate): Secondary | ICD-10-CM | POA: Diagnosis not present

## 2018-11-26 DIAGNOSIS — E1122 Type 2 diabetes mellitus with diabetic chronic kidney disease: Secondary | ICD-10-CM | POA: Diagnosis not present

## 2018-11-26 DIAGNOSIS — E785 Hyperlipidemia, unspecified: Secondary | ICD-10-CM | POA: Diagnosis not present

## 2018-12-26 DIAGNOSIS — H35342 Macular cyst, hole, or pseudohole, left eye: Secondary | ICD-10-CM | POA: Diagnosis not present

## 2018-12-26 DIAGNOSIS — H35372 Puckering of macula, left eye: Secondary | ICD-10-CM | POA: Diagnosis not present

## 2018-12-27 DIAGNOSIS — H35342 Macular cyst, hole, or pseudohole, left eye: Secondary | ICD-10-CM | POA: Diagnosis not present

## 2019-01-24 DIAGNOSIS — H35361 Drusen (degenerative) of macula, right eye: Secondary | ICD-10-CM | POA: Diagnosis not present

## 2019-01-24 DIAGNOSIS — H35372 Puckering of macula, left eye: Secondary | ICD-10-CM | POA: Diagnosis not present

## 2019-01-24 DIAGNOSIS — H35342 Macular cyst, hole, or pseudohole, left eye: Secondary | ICD-10-CM | POA: Diagnosis not present

## 2019-01-24 DIAGNOSIS — H43811 Vitreous degeneration, right eye: Secondary | ICD-10-CM | POA: Diagnosis not present

## 2019-02-27 DIAGNOSIS — E1169 Type 2 diabetes mellitus with other specified complication: Secondary | ICD-10-CM | POA: Diagnosis not present

## 2019-02-27 DIAGNOSIS — Z23 Encounter for immunization: Secondary | ICD-10-CM | POA: Diagnosis not present

## 2019-02-27 DIAGNOSIS — E785 Hyperlipidemia, unspecified: Secondary | ICD-10-CM | POA: Diagnosis not present

## 2019-02-27 DIAGNOSIS — E1122 Type 2 diabetes mellitus with diabetic chronic kidney disease: Secondary | ICD-10-CM | POA: Diagnosis not present

## 2019-02-27 DIAGNOSIS — N183 Chronic kidney disease, stage 3 unspecified: Secondary | ICD-10-CM | POA: Diagnosis not present

## 2019-02-27 DIAGNOSIS — E039 Hypothyroidism, unspecified: Secondary | ICD-10-CM | POA: Diagnosis not present

## 2019-02-27 DIAGNOSIS — Z1231 Encounter for screening mammogram for malignant neoplasm of breast: Secondary | ICD-10-CM | POA: Diagnosis not present

## 2019-02-27 DIAGNOSIS — F17200 Nicotine dependence, unspecified, uncomplicated: Secondary | ICD-10-CM | POA: Diagnosis not present

## 2019-02-27 DIAGNOSIS — I129 Hypertensive chronic kidney disease with stage 1 through stage 4 chronic kidney disease, or unspecified chronic kidney disease: Secondary | ICD-10-CM | POA: Diagnosis not present

## 2019-05-14 DIAGNOSIS — Z1231 Encounter for screening mammogram for malignant neoplasm of breast: Secondary | ICD-10-CM | POA: Diagnosis not present

## 2019-05-30 DIAGNOSIS — N1831 Chronic kidney disease, stage 3a: Secondary | ICD-10-CM | POA: Diagnosis not present

## 2019-05-30 DIAGNOSIS — I129 Hypertensive chronic kidney disease with stage 1 through stage 4 chronic kidney disease, or unspecified chronic kidney disease: Secondary | ICD-10-CM | POA: Diagnosis not present

## 2019-05-30 DIAGNOSIS — E1122 Type 2 diabetes mellitus with diabetic chronic kidney disease: Secondary | ICD-10-CM | POA: Diagnosis not present

## 2019-05-30 DIAGNOSIS — E1169 Type 2 diabetes mellitus with other specified complication: Secondary | ICD-10-CM | POA: Diagnosis not present

## 2019-05-30 DIAGNOSIS — E785 Hyperlipidemia, unspecified: Secondary | ICD-10-CM | POA: Diagnosis not present

## 2019-05-30 DIAGNOSIS — E1121 Type 2 diabetes mellitus with diabetic nephropathy: Secondary | ICD-10-CM | POA: Diagnosis not present

## 2019-06-14 DIAGNOSIS — E1169 Type 2 diabetes mellitus with other specified complication: Secondary | ICD-10-CM | POA: Diagnosis not present

## 2019-06-14 DIAGNOSIS — I129 Hypertensive chronic kidney disease with stage 1 through stage 4 chronic kidney disease, or unspecified chronic kidney disease: Secondary | ICD-10-CM | POA: Diagnosis not present

## 2019-06-14 DIAGNOSIS — E785 Hyperlipidemia, unspecified: Secondary | ICD-10-CM | POA: Diagnosis not present

## 2019-06-14 DIAGNOSIS — E1121 Type 2 diabetes mellitus with diabetic nephropathy: Secondary | ICD-10-CM | POA: Diagnosis not present

## 2019-06-14 DIAGNOSIS — E1122 Type 2 diabetes mellitus with diabetic chronic kidney disease: Secondary | ICD-10-CM | POA: Diagnosis not present

## 2019-06-14 DIAGNOSIS — N1831 Chronic kidney disease, stage 3a: Secondary | ICD-10-CM | POA: Diagnosis not present

## 2021-01-04 ENCOUNTER — Encounter: Payer: Self-pay | Admitting: Gastroenterology

## 2021-08-22 ENCOUNTER — Emergency Department (HOSPITAL_COMMUNITY): Payer: Medicare HMO

## 2021-08-22 ENCOUNTER — Other Ambulatory Visit: Payer: Self-pay

## 2021-08-22 ENCOUNTER — Observation Stay (HOSPITAL_COMMUNITY)
Admission: EM | Admit: 2021-08-22 | Discharge: 2021-08-25 | Disposition: A | Discharge status: Home / Self Care | Payer: Medicare HMO | Attending: Internal Medicine | Admitting: Internal Medicine

## 2021-08-22 ENCOUNTER — Encounter (HOSPITAL_COMMUNITY): Payer: Self-pay

## 2021-08-22 DIAGNOSIS — Z79899 Other long term (current) drug therapy: Secondary | ICD-10-CM | POA: Diagnosis not present

## 2021-08-22 DIAGNOSIS — J449 Chronic obstructive pulmonary disease, unspecified: Secondary | ICD-10-CM | POA: Diagnosis not present

## 2021-08-22 DIAGNOSIS — I129 Hypertensive chronic kidney disease with stage 1 through stage 4 chronic kidney disease, or unspecified chronic kidney disease: Secondary | ICD-10-CM | POA: Insufficient documentation

## 2021-08-22 DIAGNOSIS — E785 Hyperlipidemia, unspecified: Secondary | ICD-10-CM | POA: Diagnosis present

## 2021-08-22 DIAGNOSIS — Z7982 Long term (current) use of aspirin: Secondary | ICD-10-CM | POA: Insufficient documentation

## 2021-08-22 DIAGNOSIS — E039 Hypothyroidism, unspecified: Secondary | ICD-10-CM | POA: Insufficient documentation

## 2021-08-22 DIAGNOSIS — E1122 Type 2 diabetes mellitus with diabetic chronic kidney disease: Secondary | ICD-10-CM | POA: Diagnosis not present

## 2021-08-22 DIAGNOSIS — R932 Abnormal findings on diagnostic imaging of liver and biliary tract: Secondary | ICD-10-CM | POA: Diagnosis not present

## 2021-08-22 DIAGNOSIS — K8050 Calculus of bile duct without cholangitis or cholecystitis without obstruction: Secondary | ICD-10-CM | POA: Diagnosis not present

## 2021-08-22 DIAGNOSIS — Z9049 Acquired absence of other specified parts of digestive tract: Secondary | ICD-10-CM | POA: Diagnosis not present

## 2021-08-22 DIAGNOSIS — R933 Abnormal findings on diagnostic imaging of other parts of digestive tract: Secondary | ICD-10-CM

## 2021-08-22 DIAGNOSIS — D72829 Elevated white blood cell count, unspecified: Secondary | ICD-10-CM | POA: Diagnosis not present

## 2021-08-22 DIAGNOSIS — R17 Unspecified jaundice: Secondary | ICD-10-CM | POA: Diagnosis not present

## 2021-08-22 DIAGNOSIS — F17200 Nicotine dependence, unspecified, uncomplicated: Secondary | ICD-10-CM | POA: Diagnosis present

## 2021-08-22 DIAGNOSIS — R7989 Other specified abnormal findings of blood chemistry: Secondary | ICD-10-CM

## 2021-08-22 DIAGNOSIS — R748 Abnormal levels of other serum enzymes: Secondary | ICD-10-CM | POA: Diagnosis not present

## 2021-08-22 DIAGNOSIS — N1831 Chronic kidney disease, stage 3a: Secondary | ICD-10-CM | POA: Diagnosis not present

## 2021-08-22 DIAGNOSIS — F17210 Nicotine dependence, cigarettes, uncomplicated: Secondary | ICD-10-CM | POA: Diagnosis not present

## 2021-08-22 DIAGNOSIS — E119 Type 2 diabetes mellitus without complications: Secondary | ICD-10-CM

## 2021-08-22 DIAGNOSIS — K219 Gastro-esophageal reflux disease without esophagitis: Secondary | ICD-10-CM | POA: Insufficient documentation

## 2021-08-22 DIAGNOSIS — Z85828 Personal history of other malignant neoplasm of skin: Secondary | ICD-10-CM | POA: Diagnosis not present

## 2021-08-22 DIAGNOSIS — N838 Other noninflammatory disorders of ovary, fallopian tube and broad ligament: Secondary | ICD-10-CM | POA: Insufficient documentation

## 2021-08-22 DIAGNOSIS — M549 Dorsalgia, unspecified: Secondary | ICD-10-CM | POA: Diagnosis present

## 2021-08-22 DIAGNOSIS — I10 Essential (primary) hypertension: Secondary | ICD-10-CM | POA: Diagnosis present

## 2021-08-22 DIAGNOSIS — R911 Solitary pulmonary nodule: Secondary | ICD-10-CM | POA: Insufficient documentation

## 2021-08-22 DIAGNOSIS — R918 Other nonspecific abnormal finding of lung field: Secondary | ICD-10-CM | POA: Diagnosis present

## 2021-08-22 LAB — CBC
HCT: 42.8 % (ref 36.0–46.0)
Hemoglobin: 14.1 g/dL (ref 12.0–15.0)
MCH: 29.9 pg (ref 26.0–34.0)
MCHC: 32.9 g/dL (ref 30.0–36.0)
MCV: 90.7 fL (ref 80.0–100.0)
Platelets: 259 10*3/uL (ref 150–400)
RBC: 4.72 MIL/uL (ref 3.87–5.11)
RDW: 14.2 % (ref 11.5–15.5)
WBC: 12.5 10*3/uL — ABNORMAL HIGH (ref 4.0–10.5)
nRBC: 0 % (ref 0.0–0.2)

## 2021-08-22 LAB — BASIC METABOLIC PANEL
Anion gap: 12 (ref 5–15)
BUN: 21 mg/dL (ref 8–23)
CO2: 25 mmol/L (ref 22–32)
Calcium: 9.7 mg/dL (ref 8.9–10.3)
Chloride: 98 mmol/L (ref 98–111)
Creatinine, Ser: 1.42 mg/dL — ABNORMAL HIGH (ref 0.44–1.00)
GFR, Estimated: 38 mL/min — ABNORMAL LOW (ref >60)
Glucose, Bld: 156 mg/dL — ABNORMAL HIGH (ref 70–99)
Potassium: 3.8 mmol/L (ref 3.5–5.1)
Sodium: 135 mmol/L (ref 135–145)

## 2021-08-22 LAB — TROPONIN I (HIGH SENSITIVITY)
Troponin I (High Sensitivity): 6 ng/L (ref <18)
Troponin I (High Sensitivity): 6 ng/L (ref <18)

## 2021-08-22 NOTE — ED Triage Notes (Signed)
Pt BIB [facility] from home c/o CP that radiates to her shoulder blades. Pt also endorses nausea and diaphoresis.  ?

## 2021-08-23 ENCOUNTER — Encounter (HOSPITAL_COMMUNITY): Payer: Self-pay | Admitting: Internal Medicine

## 2021-08-23 ENCOUNTER — Inpatient Hospital Stay (HOSPITAL_COMMUNITY): Payer: Medicare HMO

## 2021-08-23 ENCOUNTER — Emergency Department (HOSPITAL_COMMUNITY): Payer: Medicare HMO

## 2021-08-23 DIAGNOSIS — E039 Hypothyroidism, unspecified: Secondary | ICD-10-CM | POA: Diagnosis not present

## 2021-08-23 DIAGNOSIS — Z85828 Personal history of other malignant neoplasm of skin: Secondary | ICD-10-CM | POA: Diagnosis not present

## 2021-08-23 DIAGNOSIS — R932 Abnormal findings on diagnostic imaging of liver and biliary tract: Secondary | ICD-10-CM | POA: Diagnosis not present

## 2021-08-23 DIAGNOSIS — R748 Abnormal levels of other serum enzymes: Secondary | ICD-10-CM | POA: Diagnosis not present

## 2021-08-23 DIAGNOSIS — R7401 Elevation of levels of liver transaminase levels: Secondary | ICD-10-CM | POA: Diagnosis not present

## 2021-08-23 DIAGNOSIS — N838 Other noninflammatory disorders of ovary, fallopian tube and broad ligament: Secondary | ICD-10-CM | POA: Diagnosis not present

## 2021-08-23 DIAGNOSIS — J449 Chronic obstructive pulmonary disease, unspecified: Secondary | ICD-10-CM | POA: Diagnosis not present

## 2021-08-23 DIAGNOSIS — I129 Hypertensive chronic kidney disease with stage 1 through stage 4 chronic kidney disease, or unspecified chronic kidney disease: Secondary | ICD-10-CM | POA: Diagnosis not present

## 2021-08-23 DIAGNOSIS — D72829 Elevated white blood cell count, unspecified: Secondary | ICD-10-CM | POA: Diagnosis not present

## 2021-08-23 DIAGNOSIS — K8050 Calculus of bile duct without cholangitis or cholecystitis without obstruction: Secondary | ICD-10-CM | POA: Diagnosis not present

## 2021-08-23 DIAGNOSIS — R918 Other nonspecific abnormal finding of lung field: Secondary | ICD-10-CM | POA: Diagnosis present

## 2021-08-23 DIAGNOSIS — R911 Solitary pulmonary nodule: Secondary | ICD-10-CM | POA: Diagnosis not present

## 2021-08-23 DIAGNOSIS — F17210 Nicotine dependence, cigarettes, uncomplicated: Secondary | ICD-10-CM | POA: Diagnosis not present

## 2021-08-23 DIAGNOSIS — Z7982 Long term (current) use of aspirin: Secondary | ICD-10-CM | POA: Diagnosis not present

## 2021-08-23 DIAGNOSIS — K219 Gastro-esophageal reflux disease without esophagitis: Secondary | ICD-10-CM | POA: Diagnosis not present

## 2021-08-23 DIAGNOSIS — Z79899 Other long term (current) drug therapy: Secondary | ICD-10-CM | POA: Diagnosis not present

## 2021-08-23 DIAGNOSIS — N1831 Chronic kidney disease, stage 3a: Secondary | ICD-10-CM | POA: Diagnosis not present

## 2021-08-23 DIAGNOSIS — M549 Dorsalgia, unspecified: Secondary | ICD-10-CM | POA: Diagnosis present

## 2021-08-23 DIAGNOSIS — E1122 Type 2 diabetes mellitus with diabetic chronic kidney disease: Secondary | ICD-10-CM | POA: Diagnosis not present

## 2021-08-23 DIAGNOSIS — R17 Unspecified jaundice: Secondary | ICD-10-CM | POA: Diagnosis not present

## 2021-08-23 DIAGNOSIS — Z9049 Acquired absence of other specified parts of digestive tract: Secondary | ICD-10-CM | POA: Diagnosis not present

## 2021-08-23 LAB — URINALYSIS, ROUTINE W REFLEX MICROSCOPIC
Bilirubin Urine: NEGATIVE
Glucose, UA: NEGATIVE mg/dL
Hgb urine dipstick: NEGATIVE
Ketones, ur: NEGATIVE mg/dL
Nitrite: NEGATIVE
Protein, ur: NEGATIVE mg/dL
Specific Gravity, Urine: 1.016 (ref 1.005–1.030)
WBC, UA: >50 WBC/hpf — ABNORMAL HIGH (ref 0–5)
pH: 6 (ref 5.0–8.0)

## 2021-08-23 LAB — HEPATIC FUNCTION PANEL
ALT: 468 U/L — ABNORMAL HIGH (ref 0–44)
AST: 448 U/L — ABNORMAL HIGH (ref 15–41)
Albumin: 3.7 g/dL (ref 3.5–5.0)
Alkaline Phosphatase: 196 U/L — ABNORMAL HIGH (ref 38–126)
Bilirubin, Direct: 2 mg/dL — ABNORMAL HIGH (ref 0.0–0.2)
Indirect Bilirubin: 1.9 mg/dL — ABNORMAL HIGH (ref 0.3–0.9)
Total Bilirubin: 3.9 mg/dL — ABNORMAL HIGH (ref 0.3–1.2)
Total Protein: 6.5 g/dL (ref 6.5–8.1)

## 2021-08-23 LAB — CBG MONITORING, ED: Glucose-Capillary: 168 mg/dL — ABNORMAL HIGH (ref 70–99)

## 2021-08-23 LAB — MAGNESIUM: Magnesium: 1.7 mg/dL (ref 1.7–2.4)

## 2021-08-23 LAB — GLUCOSE, CAPILLARY
Glucose-Capillary: 170 mg/dL — ABNORMAL HIGH (ref 70–99)
Glucose-Capillary: 112 mg/dL — ABNORMAL HIGH (ref 70–99)

## 2021-08-23 MED ORDER — ATORVASTATIN CALCIUM 40 MG PO TABS
40.0000 mg | ORAL_TABLET | Freq: Every day | ORAL | Status: DC
Start: 2021-08-23 — End: 2021-08-23

## 2021-08-23 MED ORDER — ACETAMINOPHEN 325 MG PO TABS
650.0000 mg | ORAL_TABLET | Freq: Four times a day (QID) | ORAL | Status: DC | PRN
Start: 2021-08-23 — End: 2021-08-25

## 2021-08-23 MED ORDER — LACTATED RINGERS IV SOLN: INTRAVENOUS | Status: DC

## 2021-08-23 MED ORDER — ONDANSETRON 4 MG PO TBDP
4.0000 mg | ORAL_TABLET | Freq: Four times a day (QID) | ORAL | Status: DC | PRN
  Filled 2021-08-23: qty 1

## 2021-08-23 MED ORDER — MORPHINE SULFATE (PF) 2 MG/ML IV SOLN: 2.0000 mg | INTRAVENOUS | Status: DC | PRN

## 2021-08-23 MED ORDER — IOHEXOL 350 MG/ML SOLN
80.0000 mL | Freq: Once | INTRAVENOUS | Status: AC | PRN
  Administered 2021-08-23: 80 mL via INTRAVENOUS

## 2021-08-23 MED ORDER — POLYVINYL ALCOHOL 1.4 % OP SOLN
1.0000 [drp] | OPHTHALMIC | Status: DC | PRN
  Filled 2021-08-23: qty 15

## 2021-08-23 MED ORDER — DEXTROSE-NACL 5-0.45 % IV SOLN: INTRAVENOUS | Status: DC

## 2021-08-23 MED ORDER — BUDESONIDE 0.5 MG/2ML IN SUSP
0.5000 mg | Freq: Two times a day (BID) | RESPIRATORY_TRACT | Status: DC
  Administered 2021-08-23 – 2021-08-25 (×4): 0.5 mg via RESPIRATORY_TRACT
  Filled 2021-08-23 (×4): qty 2

## 2021-08-23 MED ORDER — ONDANSETRON HCL 4 MG/2ML IJ SOLN
4.0000 mg | Freq: Four times a day (QID) | INTRAMUSCULAR | Status: DC | PRN
  Administered 2021-08-24: 4 mg via INTRAVENOUS
  Filled 2021-08-23: qty 2

## 2021-08-23 MED ORDER — LEVOTHYROXINE SODIUM 88 MCG PO TABS
88.0000 ug | ORAL_TABLET | Freq: Every day | ORAL | Status: DC
  Administered 2021-08-24 – 2021-08-25 (×2): 88 ug via ORAL
  Filled 2021-08-23 (×3): qty 1

## 2021-08-23 MED ORDER — HYDRALAZINE HCL 20 MG/ML IJ SOLN: 10.0000 mg | INTRAMUSCULAR | Status: DC | PRN

## 2021-08-23 MED ORDER — OXYCODONE HCL 5 MG PO TABS
5.0000 mg | ORAL_TABLET | ORAL | Status: DC | PRN
  Filled 2021-08-23: qty 2

## 2021-08-23 MED ORDER — PANTOPRAZOLE SODIUM 40 MG PO TBEC
40.0000 mg | DELAYED_RELEASE_TABLET | Freq: Every day | ORAL | Status: DC
  Administered 2021-08-23 – 2021-08-25 (×2): 40 mg via ORAL
  Filled 2021-08-23 (×3): qty 1

## 2021-08-23 MED ORDER — GADOBUTROL 1 MMOL/ML IV SOLN
6.0000 mL | Freq: Once | INTRAVENOUS | Status: AC | PRN
  Administered 2021-08-23: 6 mL via INTRAVENOUS

## 2021-08-23 MED ORDER — ALBUTEROL SULFATE (2.5 MG/3ML) 0.083% IN NEBU: 3.0000 mL | INHALATION_SOLUTION | RESPIRATORY_TRACT | Status: DC | PRN

## 2021-08-23 MED ORDER — FLUTICASONE PROPIONATE HFA 110 MCG/ACT IN AERO: 2.0000 | INHALATION_SPRAY | Freq: Two times a day (BID) | RESPIRATORY_TRACT | Status: DC

## 2021-08-23 MED ORDER — SODIUM CHLORIDE 0.9 % IV SOLN
2.0000 g | Freq: Once | INTRAVENOUS | Status: AC
  Administered 2021-08-23: 2 g via INTRAVENOUS
  Filled 2021-08-23: qty 20

## 2021-08-23 MED ORDER — ACETAMINOPHEN 650 MG RE SUPP: 650.0000 mg | Freq: Four times a day (QID) | RECTAL | Status: DC | PRN

## 2021-08-23 MED ORDER — INSULIN ASPART 100 UNIT/ML IJ SOLN: 0.0000 [IU] | Freq: Every day | INTRAMUSCULAR | Status: DC

## 2021-08-23 MED ORDER — NICOTINE 21 MG/24HR TD PT24
21.0000 mg | MEDICATED_PATCH | Freq: Every day | TRANSDERMAL | Status: DC | PRN
  Administered 2021-08-23: 21 mg via TRANSDERMAL
  Filled 2021-08-23: qty 1

## 2021-08-23 MED ORDER — POLYVINYL ALCOHOL-POVIDONE PF 1.4-0.6 % OP SOLN: 1.0000 [drp] | Freq: Four times a day (QID) | OPHTHALMIC | Status: DC | PRN

## 2021-08-23 MED ORDER — ASPIRIN 81 MG PO CHEW
81.0000 mg | CHEWABLE_TABLET | Freq: Every day | ORAL | Status: DC
  Administered 2021-08-23 – 2021-08-25 (×2): 81 mg via ORAL
  Filled 2021-08-23 (×3): qty 1

## 2021-08-23 MED ORDER — LACTATED RINGERS IV BOLUS
1000.0000 mL | Freq: Once | INTRAVENOUS | Status: AC
  Administered 2021-08-23: 1000 mL via INTRAVENOUS

## 2021-08-23 MED ORDER — INSULIN ASPART 100 UNIT/ML IJ SOLN
0.0000 [IU] | Freq: Three times a day (TID) | INTRAMUSCULAR | Status: DC
  Administered 2021-08-23: 3 [IU] via SUBCUTANEOUS
  Administered 2021-08-24: 11 [IU] via SUBCUTANEOUS
  Administered 2021-08-25: 3 [IU] via SUBCUTANEOUS

## 2021-08-23 NOTE — ED Notes (Signed)
A&O x 4. No signs of distress. No complaints of pain. No n/v.  ?

## 2021-08-23 NOTE — Discharge Instructions (Addendum)
CT findings needing follow up: ? ?Multiple pulmonary nodules. Most severe: 7 mm solid pulmonary nodule  ?within the upper lobe. Recommend a non-contrast Chest CT at 3-6  ?months. If patient is high risk for malignancy, recommend an  ?additional non-contrast Chest CT at 18-24 months; if patient is low  ?risk for malignancy a non-contrast Chest CT at 18-24 months is  ?optional.  ?Prominent ovaries bilaterally measuring up to 4.8 cm on the left.  ?Given the patient's age these are somewhat suspicious. Nonemergent ?pelvic ultrasound can be performed for further workup. ?

## 2021-08-23 NOTE — H&P (View-Only) (Signed)
Attending physician's note  ? ?I have taken a history, reviewed the chart, and examined the patient. I performed a substantive portion of this encounter, including complete performance of at least one of the key components, in conjunction with the APP. I agree with the APP's note, impression, and recommendations with my edits.  ? ?76 year old female with a history of diabetes, COPD, GERD c/b erosive esophagitis and Barrett's Esophagus, colon polyps, cholecystectomy 2012, presents with pain between her shoulder blades and palpitations.  Symptoms have essentially resolved since admission, but admission evaluation notable for significant elevated liver enzymes and suspicion for choledocholithiasis. ? ?- WBC 12.5, otherwise normal CBC ?- AST/ALT 4-48/468, T. bili 3.9, direct 2.0, ALP 196 ?- BUN/creatinine 21/1.4, otherwise normal BMP ?- CT C/A/P: Hepatic steatosis, ccy, small calcification distal CBD suspicious for CDL.  Normal pancreas.  Multiple pulmonary nodules ? ?1) Elevated liver enzymes ?2) Hyperbilirubinemia ?3) Leukocytosis ? ?- MRCP ?- Will follow-up MRCP results to determine whether or not ERCP is warranted ?- Continue trending liver enzymes ?- Evaluation/follow-up of pulmonary nodules per primary service ?- Evaluation of prominent ovaries noted on CT per primary service ?- GI service will continue to follow ? ?[address] [facility] ?[phone] office  ? ?   ? ? ? ?                                                                                              ? ?                                                                          Erika Mason: ?8:08 AM ?08/23/2021 ? LOS: 0 days  ? ? ?Referring Provider: Dr Erika Mason  ?Primary Care Physician:  Erika [facility], No ?Primary Gastroenterologist:  Erika [facility].    ? ? ? ?Reason for Consultation:  Choledocholithiasis.   ?  ?HPI: Erika Mason is a 76 y.o. female.  PMH below.  Diabetic on Metformin.  COPD.  Cholecystectomy 2012.  Colon polyps  and Barretts esophagus.   ? ?Latest colonoscopy in 08/2010.  For follow-up of multiple TA polyps and SSA in 2009.  10 polyps noted, these either removed or biopsied (path: Hyperplastic).  Mild sigmoid diverticulosis. ?08/2010 EGD.  For GERD, heartburn.  Grade 1 distal esophagitis without stricture.  Biopsied, path: Barrett's esophagus without dysplasia or malignancy.  ?Pt overdue for surveillance colonoscopy, EGD.   ? ?On Saturday she did a lot of spring cleaning which involved vacuuming and rearranging things.  She did not do much heavy lifting but was more physically than normal.  That night she started noticing skipping beats/palpitations over the period of 2 to 3 hours.  This was not associated with shortness of breath or chest pain.  She went to bed and in the morning the heart rate problem had resolved.  However  then she had pain between her shoulder blades and developed a sense of clamminess, sweating.  Felt presyncopal, nauseous but did not vomit.  No change in appearance of her stools.  Her urine was dark orange.  No belly pain.  No significant heartburn or changes in appetite over several months.  Stable weight. ?Presented to the ED for evaluation via EMS.  Overnight the symptoms have resolved.  She has no more pain between her shoulder blades, no further palpitations. ? ?T. bili 3.9, direct 2.0.  Alk phos 196.  AST/ALT 448/468.  No lipase. ?Basic chemistries with elevated creatinine 1.4, GFR 54.  Glucose 156. ?Cardiac enzymes not elevated. ?WBCs 12.5.  Normal Hgb, MCV, platelets.  Normal INR ?Urinalysis shows large leukocytes, no nitrites, many bacteria greater than 50 WBCs. ?EKG shows sinus rhythm with frequent, consecutive PVCs. ?CXR shows stable lingular lung nodules. ?CTA abdomen/pelvis/chest reveals fatty liver.  Small calcifications at distal CBD.  GB surgically absent.  Unremarkable pancreas and PD.  Nephrolithiasis.  Colon diverticulosis.  Uterus surgically absent with prominent bilateral ovaries.   Adrenal adenomas. Scattered pulmonary nodules.  No PE. ? ?Started on Rocephin for UTI. ? ?Family history includes diabetes, heart disease in parents.  Brother had stroke.  No issues with the biliary tract or gallbladder.  No history of GI cancers. ?Patient does not drink alcohol.  Smokes 15 cigarettes a day.  Her brother lives with her.  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 ? ?Past Medical History:  ?Diagnosis Date  ? BCC (basal cell carcinoma), face   ? nasolabial fold, s/p MOHS  ? Cholelithiasis   ? COPD (chronic obstructive pulmonary disease) ([place])   ? Diabetes mellitus without complication ([place])   ? borderline not on meds   ? Diverticulosis   ? GERD (gastroesophageal reflux disease)   ? Barrett's Esophagus  ? History of colon polyps 2009, 2012  ? Hyperlipidemia   ? Hypertension   ? patient denies at preop visit of 08/07/14, htn noted in cardiology history done 07/2014   ? Hypothyroidism   ? Low bone mass 2008  ? Nephrolithiasis   ? staghorn  ? PONV (postoperative nausea and vomiting)   ? Thyroid disease   ? Vitamin D insufficiency   ? 04/2009  ? ? ?Past Surgical History:  ?Procedure Laterality Date  ? CATARACT EXTRACTION, BILATERAL    ? CHOLECYSTECTOMY  2012  ? CYSTOSCOPY WITH URETEROSCOPY AND STENT PLACEMENT Left 08/22/2014  ? Procedure: LEFT URETEROSCOPY, LASER LITHOTRIPSY AND STENT EXCHANGE;  Surgeon: Erika Hughs, MD;  Location: [facility];  Service: Urology;  Laterality: Left;  ? HOLMIUM LASER APPLICATION N/A 04/17/107  ? Procedure: HOLMIUM LASER APPLICATION;  Surgeon: Erika Hughs, MD;  Location: [facility];  Service: Urology;  Laterality: N/A;  ? HOLMIUM LASER APPLICATION N/A 07/02/5571  ? Procedure: HOLMIUM LASER APPLICATION;  Surgeon: Erika Hughs, MD;  Location: [facility];  Service: Urology;  Laterality: N/A;  ? MOHS SURGERY    ? nasolabial fold  ? NEPHROLITHOTOMY Left 08/15/2014  ? Procedure: LEFT PERCUTANEOUS NEPHROLITHOTOMY WITH TWO SURGEON'S ACCESS ;  Surgeon: Erika Hughs,  MD;  Location: [facility];  Service: Urology;  Laterality: Left;  ? PARATHYROIDECTOMY    ? VAGINAL HYSTERECTOMY    ? ? ?Prior to Admission medications   ?Medication Sig Start Date End Date Taking? Authorizing Provider  ?albuterol (PROVENTIL HFA;VENTOLIN HFA) 108 (90 Base) MCG/ACT inhaler Inhale 2 puffs into the lungs every 4 (four) hours as needed  for wheezing or shortness of breath. 09/12/17  Yes Harrison Mons, PA  ?aspirin 81 MG tablet Take 1 tablet (81 mg total) by mouth daily. 08/03/15  Yes Jeffery, Domingo Mend, PA  ?atorvastatin (LIPITOR) 40 MG tablet TAKE 1 TABLET EVERY DAY ?Patient taking differently: Take 40 mg by mouth at bedtime. 05/24/17  Yes Jeffery, Domingo Mend, PA  ?FLOVENT HFA 110 MCG/ACT inhaler Inhale 2 puffs into the lungs 2 (two) times daily. 08/05/21  Yes [provider]  ?ibuprofen (ADVIL) 200 MG tablet Take 200 mg by mouth every 6 (six) hours as needed for headache or moderate pain.   Yes [provider]  ?levothyroxine (SYNTHROID, LEVOTHROID) 88 MCG tablet TAKE 1 TABLET EVERY DAY ?Patient taking differently: Take 88 mcg by mouth daily before breakfast. 05/24/17  Yes Jeffery, Chelle, PA  ?losartan-hydrochlorothiazide (HYZAAR) 50-12.5 MG tablet Take 1 tablet by mouth daily. 08/05/21  Yes [provider]  ?metFORMIN (GLUCOPHAGE) 500 MG tablet Take 1 tablet (500 mg total) by mouth 2 (two) times daily with a meal. 04/18/17  Yes Jeffery, Chelle, PA  ?pantoprazole (PROTONIX) 40 MG tablet TAKE 1 TABLET EVERY DAY ?Patient taking differently: Take 40 mg by mouth daily. 05/24/17  Yes Harrison Mons, [place]  ?Polyvinyl Alcohol-Povidone (REFRESH OP) Place 1 drop into both eyes 2 (two) times daily as needed (dry eyes).   Yes [provider]  ?sodium chloride (OCEAN) 0.65 % SOLN nasal spray Place 1 spray into both nostrils as needed for congestion.   Yes [provider]  ?vitamin B-12 (CYANOCOBALAMIN) 1000 MCG tablet Take 1,000 mcg by mouth daily.   Yes [provider]   ?hydrochlorothiazide (HYDRODIURIL) 25 MG tablet TAKE 1 TABLET EVERY MORNING ?Patient not taking: Reported on 08/23/2021 05/24/17   Harrison Mons, PA  ? ? ?Scheduled Meds: ? ?Infusions: ? dextrose 5 % and 0.4

## 2021-08-23 NOTE — Progress Notes (Signed)
Pt arrived to unit

## 2021-08-23 NOTE — H&P (Signed)
?History and Physical  ? ? ?Patient: Erika Mason ESP:[id number] DOB: 12-04-1945 ?DOA: 08/22/2021 ?DOS: the patient was seen and examined on 08/23/2021 ?PCP: Pcp, No  ?Patient coming from: Home - lives alone; NOK: Karly, [place], [phone] ? ? ?Chief Complaint: Chest pain ? ?HPI: Erika Mason is a 76 y.o. female with medical history significant of COPD; DM;  HTN; HLD; and hypothyroidism presenting with chest pain.  She reports pain that started in her shoulder blades and radiates around her chest.  She has been having the issue for much of the weekend.  She went for a lovely lunch yesterday and didn't want to tell her family what was happening.  She had nausea.  She had normal colored urine yesterday but then it turned orange (no other urinary symptoms).  She also had some irregular heart beats associated.  She has a very remote h/o cholecystectomy. ? ? ? ?ER Course:  Carryover, per Dr. Alcario Drought: ? ?76 yo F with choledocholithiasis probably.  Had cholecystectomy in distant past though.  Stone on CT, elevated LFTs.  EDP sent secure chat to Dr. Henrene Pastor.  NPO, on IVF.  Got rocephin for UTI. ? ? ? ? ?Review of Systems: As mentioned in the history of present illness. All other systems reviewed and are negative. ?Past Medical History:  ?Diagnosis Date  ? BCC (basal cell carcinoma), face   ? nasolabial fold, s/p MOHS  ? Cholelithiasis   ? COPD (chronic obstructive pulmonary disease) ([place])   ? Diabetes mellitus without complication ([place])   ? borderline not on meds   ? Diverticulosis   ? GERD (gastroesophageal reflux disease)   ? Barrett's Esophagus  ? History of colon polyps 2009, 2012  ? Hyperlipidemia   ? Hypertension   ? patient denies at preop visit of 08/07/14, htn noted in cardiology history done 07/2014   ? Hypothyroidism   ? Low bone mass 2008  ? Nephrolithiasis   ? staghorn  ? PONV (postoperative nausea and vomiting)   ? Thyroid disease   ? Vitamin D insufficiency   ? 04/2009  ? ?Past Surgical  History:  ?Procedure Laterality Date  ? CATARACT EXTRACTION, BILATERAL    ? CHOLECYSTECTOMY  2012  ? CYSTOSCOPY WITH URETEROSCOPY AND STENT PLACEMENT Left 08/22/2014  ? Procedure: LEFT URETEROSCOPY, LASER LITHOTRIPSY AND STENT EXCHANGE;  Surgeon: Ardis Hughs, MD;  Location: [facility];  Service: Urology;  Laterality: Left;  ? HOLMIUM LASER APPLICATION N/A 09/12/8935  ? Procedure: HOLMIUM LASER APPLICATION;  Surgeon: Ardis Hughs, MD;  Location: [facility];  Service: Urology;  Laterality: N/A;  ? HOLMIUM LASER APPLICATION N/A 3/42/8768  ? Procedure: HOLMIUM LASER APPLICATION;  Surgeon: Ardis Hughs, MD;  Location: [facility];  Service: Urology;  Laterality: N/A;  ? MOHS SURGERY    ? nasolabial fold  ? NEPHROLITHOTOMY Left 08/15/2014  ? Procedure: LEFT PERCUTANEOUS NEPHROLITHOTOMY WITH TWO SURGEON'S ACCESS ;  Surgeon: Ardis Hughs, MD;  Location: [facility];  Service: Urology;  Laterality: Left;  ? PARATHYROIDECTOMY    ? VAGINAL HYSTERECTOMY    ? ?Social History:  reports that she has been smoking cigarettes. She has a 30.00 pack-year smoking history. She has quit using smokeless tobacco.  Her smokeless tobacco use included snuff. She reports that she does not drink alcohol and does not use drugs. ? ?Allergies  ?Allergen Reactions  ? Actonel [Risedronate Sodium] Other (See Comments)  ?  Chest Pain/Severe GERD  ? ? ?Family History  ?Problem Relation Age  of Onset  ? Heart disease Father   ? Stroke Brother   ? Diabetes Mother   ? ? ?Prior to Admission medications   ?Medication Sig Start Date End Date Taking? Authorizing Provider  ?albuterol (PROVENTIL HFA;VENTOLIN HFA) 108 (90 Base) MCG/ACT inhaler Inhale 2 puffs into the lungs every 4 (four) hours as needed for wheezing or shortness of breath. 09/12/17  Yes Harrison Mons, PA  ?aspirin 81 MG tablet Take 1 tablet (81 mg total) by mouth daily. 08/03/15  Yes Jeffery, Domingo Mend, PA  ?atorvastatin (LIPITOR) 40 MG tablet TAKE 1 TABLET EVERY DAY ?Patient taking differently:  Take 40 mg by mouth at bedtime. 05/24/17  Yes Jeffery, Domingo Mend, PA  ?FLOVENT HFA 110 MCG/ACT inhaler Inhale 2 puffs into the lungs 2 (two) times daily. 08/05/21  Yes [provider]  ?ibuprofen (ADVIL) 200 MG tablet Take 200 mg by mouth every 6 (six) hours as needed for headache or moderate pain.   Yes [provider]  ?levothyroxine (SYNTHROID, LEVOTHROID) 88 MCG tablet TAKE 1 TABLET EVERY DAY ?Patient taking differently: Take 88 mcg by mouth daily before breakfast. 05/24/17  Yes Jeffery, Chelle, PA  ?losartan-hydrochlorothiazide (HYZAAR) 50-12.5 MG tablet Take 1 tablet by mouth daily. 08/05/21  Yes [provider]  ?metFORMIN (GLUCOPHAGE) 500 MG tablet Take 1 tablet (500 mg total) by mouth 2 (two) times daily with a meal. 04/18/17  Yes Jeffery, Chelle, PA  ?pantoprazole (PROTONIX) 40 MG tablet TAKE 1 TABLET EVERY DAY ?Patient taking differently: Take 40 mg by mouth daily. 05/24/17  Yes Harrison Mons, [place]  ?Polyvinyl Alcohol-Povidone (REFRESH OP) Place 1 drop into both eyes 2 (two) times daily as needed (dry eyes).   Yes [provider]  ?sodium chloride (OCEAN) 0.65 % SOLN nasal spray Place 1 spray into both nostrils as needed for congestion.   Yes [provider]  ?vitamin B-12 (CYANOCOBALAMIN) 1000 MCG tablet Take 1,000 mcg by mouth daily.   Yes [provider]  ?hydrochlorothiazide (HYDRODIURIL) 25 MG tablet TAKE 1 TABLET EVERY MORNING ?Patient not taking: Reported on 08/23/2021 05/24/17   Harrison Mons, [place]  ? ? ?Physical Exam: ?Vitals:  ? 08/23/21 1253 08/23/21 1300 08/23/21 1601 08/23/21 1712  ?BP: 134/76 134/67 128/74 135/70  ?Pulse: 73 67 76 72  ?Resp: '20  18 18  '$ ?Temp:   98.2 ?F (36.8 ?C) 98.7 ?F (37.1 ?C)  ?TempSrc:    Oral  ?SpO2: 92% 90% 92% 94%  ?Weight:      ?Height:      ? ?General:  Appears calm and comfortable and is in NAD ?Eyes:   EOMI, normal lids, iris ?ENT:  grossly normal hearing, lips & tongue, mmm ?Neck:  no LAD, masses or  thyromegaly ?Cardiovascular:  RRR, no m/r/g. No LE edema.  ?Respiratory:   CTA bilaterally with no wheezes/rales/rhonchi.  Normal respiratory effort. ?Abdomen:  soft, NT, ND ?Skin:  no rash or induration seen on limited exam ?Musculoskeletal:  grossly normal tone BUE/BLE, good ROM, no bony abnormality ?Psychiatric:  blunted/mildly anxious mood and affect, speech fluent and appropriate, AOx3 ?Neurologic:  CN 2-12 grossly intact, moves all extremities in coordinated fashion ? ? ?Radiological Exams on Admission: ?Independently reviewed - see discussion in A/P where applicable ? ?DG Chest 2 View ? ?Result Date: 08/22/2021 ?CLINICAL DATA:  Chest pain EXAM: CHEST - 2 VIEW COMPARISON:  08/15/2014 FINDINGS: Frontal and lateral views of the chest demonstrate an unremarkable cardiac silhouette. No airspace disease, effusion, or pneumothorax. 7 mm lingular nodules unchanged since CTs  dating to 2014, benign. No acute bony abnormalities. IMPRESSION: 1. No acute intrathoracic process. Electronically Signed   By: Randa Ngo M.D.   On: 08/22/2021 18:41  ? ?MR 3D Recon At Scanner ? ?Result Date: 08/23/2021 ?CLINICAL DATA:  Choledocholithiasis on CT angiography from earlier today. Cholecystectomy. EXAM: MRI ABDOMEN WITHOUT AND WITH CONTRAST (INCLUDING MRCP) TECHNIQUE: Multiplanar multisequence MR imaging of the abdomen was performed both before and after the administration of intravenous contrast. Heavily T2-weighted images of the biliary and pancreatic ducts were obtained, and three-dimensional MRCP images were rendered by post processing. CONTRAST:  50m GADAVIST GADOBUTROL 1 MMOL/ML IV SOLN COMPARISON:  08/23/2021 CT angiogram of the chest, abdomen and pelvis. FINDINGS: Lower chest: Left upper lobe 7 mm pulmonary nodule (series 11/image 95), unchanged from CT from earlier today. Hepatobiliary: Normal liver size and configuration. Marked diffuse hepatic steatosis. Three scattered right liver hemangiomas with progressive  discontinuous peripheral nodular enhancement, largest 1.5 cm in the segment 7 right liver (series 4/image 111). Additional scattered subcentimeter simple liver cysts. No suspicious liver masses. Status post cholecystectomy. Bile ducts a

## 2021-08-23 NOTE — ED Notes (Signed)
Patient transported to MRI 

## 2021-08-23 NOTE — Consult Note (Addendum)
Attending physician's note  ? ?I have taken a history, reviewed the chart, and examined the patient. I performed a substantive portion of this encounter, including complete performance of at least one of the key components, in conjunction with the APP. I agree with the APP's note, impression, and recommendations with my edits.  ? ?76 year old female with a history of diabetes, COPD, GERD c/b erosive esophagitis and Barrett's Esophagus, colon polyps, cholecystectomy 2012, presents with pain between her shoulder blades and palpitations.  Symptoms have essentially resolved since admission, but admission evaluation notable for significant elevated liver enzymes and suspicion for choledocholithiasis. ? ?- WBC 12.5, otherwise normal CBC ?- AST/ALT 4-48/468, T. bili 3.9, direct 2.0, ALP 196 ?- BUN/creatinine 21/1.4, otherwise normal BMP ?- CT C/A/P: Hepatic steatosis, ccy, small calcification distal CBD suspicious for CDL.  Normal pancreas.  Multiple pulmonary nodules ? ?1) Elevated liver enzymes ?2) Hyperbilirubinemia ?3) Leukocytosis ? ?- MRCP ?- Will follow-up MRCP results to determine whether or not ERCP is warranted ?- Continue trending liver enzymes ?- Evaluation/follow-up of pulmonary nodules per primary service ?- Evaluation of prominent ovaries noted on CT per primary service ?- GI service will continue to follow ? ?[address] [name] ?[phone] office  ? ?   ? ? ? ?                                                                                              ? ?                                                                          [facility] Consult: ?8:08 AM ?08/23/2021 ? LOS: 0 days  ? ? ?Referring Provider: Dr Thomasenia Bottoms  ?Primary Care Physician:  Pcp, No ?Primary Gastroenterologist:  Dr. Delfin Edis.    ? ? ? ?Reason for Consultation:  Choledocholithiasis.   ?  ?HPI: Erika Mason is a 76 y.o. female.  PMH below.  Diabetic on Metformin.  COPD.  Cholecystectomy 2012.  Colon polyps  and Barretts esophagus.   ? ?Latest colonoscopy in 08/2010.  For follow-up of multiple TA polyps and SSA in 2009.  10 polyps noted, these either removed or biopsied (path: Hyperplastic).  Mild sigmoid diverticulosis. ?08/2010 EGD.  For GERD, heartburn.  Grade 1 distal esophagitis without stricture.  Biopsied, path: Barrett's esophagus without dysplasia or malignancy.  ?Pt overdue for surveillance colonoscopy, EGD.   ? ?On Saturday she did a lot of spring cleaning which involved vacuuming and rearranging things.  She did not do much heavy lifting but was more physically than normal.  That night she started noticing skipping beats/palpitations over the period of 2 to 3 hours.  This was not associated with shortness of breath or chest pain.  She went to bed and in the morning the heart rate problem had resolved.  However  then she had pain between her shoulder blades and developed a sense of clamminess, sweating.  Felt presyncopal, nauseous but did not vomit.  No change in appearance of her stools.  Her urine was dark orange.  No belly pain.  No significant heartburn or changes in appetite over several months.  Stable weight. ?Presented to the ED for evaluation via EMS.  Overnight the symptoms have resolved.  She has no more pain between her shoulder blades, no further palpitations. ? ?T. bili 3.9, direct 2.0.  Alk phos 196.  AST/ALT 448/468.  No lipase. ?Basic chemistries with elevated creatinine 1.4, GFR 54.  Glucose 156. ?Cardiac enzymes not elevated. ?WBCs 12.5.  Normal Hgb, MCV, platelets.  Normal INR ?Urinalysis shows large leukocytes, no nitrites, many bacteria greater than 50 WBCs. ?EKG shows sinus rhythm with frequent, consecutive PVCs. ?CXR shows stable lingular lung nodules. ?CTA abdomen/pelvis/chest reveals fatty liver.  Small calcifications at distal CBD.  GB surgically absent.  Unremarkable pancreas and PD.  Nephrolithiasis.  Colon diverticulosis.  Uterus surgically absent with prominent bilateral ovaries.   Adrenal adenomas. Scattered pulmonary nodules.  No PE. ? ?Started on Rocephin for UTI. ? ?Family history includes diabetes, heart disease in parents.  Brother had stroke.  No issues with the biliary tract or gallbladder.  No history of GI cancers. ?Patient does not drink alcohol.  Smokes 15 cigarettes a day.  Her brother lives with her.  She is retired from office work in an IT consultant. ? ?Past Medical History:  ?Diagnosis Date  ? BCC (basal cell carcinoma), face   ? nasolabial fold, s/p MOHS  ? Cholelithiasis   ? COPD (chronic obstructive pulmonary disease) ([place])   ? Diabetes mellitus without complication ([place])   ? borderline not on meds   ? Diverticulosis   ? GERD (gastroesophageal reflux disease)   ? Barrett's Esophagus  ? History of colon polyps 2009, 2012  ? Hyperlipidemia   ? Hypertension   ? patient denies at preop visit of 08/07/14, htn noted in cardiology history done 07/2014   ? Hypothyroidism   ? Low bone mass 2008  ? Nephrolithiasis   ? staghorn  ? PONV (postoperative nausea and vomiting)   ? Thyroid disease   ? Vitamin D insufficiency   ? 04/2009  ? ? ?Past Surgical History:  ?Procedure Laterality Date  ? CATARACT EXTRACTION, BILATERAL    ? CHOLECYSTECTOMY  2012  ? CYSTOSCOPY WITH URETEROSCOPY AND STENT PLACEMENT Left 08/22/2014  ? Procedure: LEFT URETEROSCOPY, LASER LITHOTRIPSY AND STENT EXCHANGE;  Surgeon: Ardis Hughs, MD;  Location: [facility];  Service: Urology;  Laterality: Left;  ? HOLMIUM LASER APPLICATION N/A 10/17/9379  ? Procedure: HOLMIUM LASER APPLICATION;  Surgeon: Ardis Hughs, MD;  Location: [facility];  Service: Urology;  Laterality: N/A;  ? HOLMIUM LASER APPLICATION N/A 0/17/5102  ? Procedure: HOLMIUM LASER APPLICATION;  Surgeon: Ardis Hughs, MD;  Location: [facility];  Service: Urology;  Laterality: N/A;  ? MOHS SURGERY    ? nasolabial fold  ? NEPHROLITHOTOMY Left 08/15/2014  ? Procedure: LEFT PERCUTANEOUS NEPHROLITHOTOMY WITH TWO SURGEON'S ACCESS ;  Surgeon: Ardis Hughs,  MD;  Location: [facility];  Service: Urology;  Laterality: Left;  ? PARATHYROIDECTOMY    ? VAGINAL HYSTERECTOMY    ? ? ?Prior to Admission medications   ?Medication Sig Start Date End Date Taking? Authorizing Provider  ?albuterol (PROVENTIL HFA;VENTOLIN HFA) 108 (90 Base) MCG/ACT inhaler Inhale 2 puffs into the lungs every 4 (four) hours as needed  for wheezing or shortness of breath. 09/12/17  Yes Harrison Mons, PA  ?aspirin 81 MG tablet Take 1 tablet (81 mg total) by mouth daily. 08/03/15  Yes Jeffery, Domingo Mend, PA  ?atorvastatin (LIPITOR) 40 MG tablet TAKE 1 TABLET EVERY DAY ?Patient taking differently: Take 40 mg by mouth at bedtime. 05/24/17  Yes Jeffery, Domingo Mend, PA  ?FLOVENT HFA 110 MCG/ACT inhaler Inhale 2 puffs into the lungs 2 (two) times daily. 08/05/21  Yes [provider]  ?ibuprofen (ADVIL) 200 MG tablet Take 200 mg by mouth every 6 (six) hours as needed for headache or moderate pain.   Yes [provider]  ?levothyroxine (SYNTHROID, LEVOTHROID) 88 MCG tablet TAKE 1 TABLET EVERY DAY ?Patient taking differently: Take 88 mcg by mouth daily before breakfast. 05/24/17  Yes Jeffery, [place], PA  ?losartan-hydrochlorothiazide (HYZAAR) 50-12.5 MG tablet Take 1 tablet by mouth daily. 08/05/21  Yes [provider]  ?metFORMIN (GLUCOPHAGE) 500 MG tablet Take 1 tablet (500 mg total) by mouth 2 (two) times daily with a meal. 04/18/17  Yes Jeffery, Chelle, PA  ?pantoprazole (PROTONIX) 40 MG tablet TAKE 1 TABLET EVERY DAY ?Patient taking differently: Take 40 mg by mouth daily. 05/24/17  Yes Harrison Mons, [place]  ?Polyvinyl Alcohol-Povidone (REFRESH OP) Place 1 drop into both eyes 2 (two) times daily as needed (dry eyes).   Yes [provider]  ?sodium chloride (OCEAN) 0.65 % SOLN nasal spray Place 1 spray into both nostrils as needed for congestion.   Yes [provider]  ?vitamin B-12 (CYANOCOBALAMIN) 1000 MCG tablet Take 1,000 mcg by mouth daily.   Yes [provider]   ?hydrochlorothiazide (HYDRODIURIL) 25 MG tablet TAKE 1 TABLET EVERY MORNING ?Patient not taking: Reported on 08/23/2021 05/24/17   Harrison Mons, PA  ? ? ?Scheduled Meds: ? ?Infusions: ? dextrose 5 % and 0.4

## 2021-08-23 NOTE — ED Provider Notes (Signed)
?[facility] ?Provider Note ? ? ?CSN: [id number] ?Arrival date & time: 08/22/21  1707 ? ?  ? ?History ? ?Chief Complaint  ?Patient presents with  ? Chest Pain  ? ? ?Erika Mason is a 76 y.o. female. ? ?76 year old female that presents to the ER today with back pain.  Patient is with her niece and grand niece.  They help supplement the history.  Patient states that earlier today prior to coming in and she had a feeling of pain in her back between her shoulder blades.  She states that it was pretty bad associated with nausea.  This persisted for quite a while.  During this time she had an episode where she got pale and clammy and felt like she was going to pass out.  No chest pain.  No shortness of breath.  She states that she urinated and it was orange this has happened twice since she has been here.  It has never happened to her before.  She states that a couple days ago she had an episode for couple hours where she had 5-6 missed beats per hour.  She states that this is abnormal.  She has had missed beats before but not that frequency for that long.  At this time she is asymptomatic.  She does have a history of smoking, diabetes, hypertension. ? ? ?Chest Pain ? ?  ? ?Home Medications ?Prior to Admission medications   ?Medication Sig Start Date End Date Taking? Authorizing Provider  ?albuterol (PROVENTIL HFA;VENTOLIN HFA) 108 (90 Base) MCG/ACT inhaler Inhale 2 puffs into the lungs every 4 (four) hours as needed for wheezing or shortness of breath. 09/12/17  Yes Harrison Mons, PA  ?aspirin 81 MG tablet Take 1 tablet (81 mg total) by mouth daily. 08/03/15  Yes Jeffery, Domingo Mend, PA  ?atorvastatin (LIPITOR) 40 MG tablet TAKE 1 TABLET EVERY DAY ?Patient taking differently: Take 40 mg by mouth at bedtime. 05/24/17  Yes Jeffery, Domingo Mend, PA  ?FLOVENT HFA 110 MCG/ACT inhaler Inhale 2 puffs into the lungs 2 (two) times daily. 08/05/21  Yes [provider]  ?ibuprofen (ADVIL) 200  MG tablet Take 200 mg by mouth every 6 (six) hours as needed for headache or moderate pain.   Yes [provider]  ?levothyroxine (SYNTHROID, LEVOTHROID) 88 MCG tablet TAKE 1 TABLET EVERY DAY ?Patient taking differently: Take 88 mcg by mouth daily before breakfast. 05/24/17  Yes Jeffery, Chelle, PA  ?losartan-hydrochlorothiazide (HYZAAR) 50-12.5 MG tablet Take 1 tablet by mouth daily. 08/05/21  Yes [provider]  ?metFORMIN (GLUCOPHAGE) 500 MG tablet Take 1 tablet (500 mg total) by mouth 2 (two) times daily with a meal. 04/18/17  Yes Jeffery, Chelle, PA  ?pantoprazole (PROTONIX) 40 MG tablet TAKE 1 TABLET EVERY DAY ?Patient taking differently: Take 40 mg by mouth daily. 05/24/17  Yes Harrison Mons, [place]  ?Polyvinyl Alcohol-Povidone (REFRESH OP) Place 1 drop into both eyes 2 (two) times daily as needed (dry eyes).   Yes [provider]  ?sodium chloride (OCEAN) 0.65 % SOLN nasal spray Place 1 spray into both nostrils as needed for congestion.   Yes [provider]  ?vitamin B-12 (CYANOCOBALAMIN) 1000 MCG tablet Take 1,000 mcg by mouth daily.   Yes [provider]  ?hydrochlorothiazide (HYDRODIURIL) 25 MG tablet TAKE 1 TABLET EVERY MORNING ?Patient not taking: Reported on 08/23/2021 05/24/17   Harrison Mons, [place]  ?   ? ?Allergies    ?Actonel [risedronate sodium]   ? ?Review  of Systems   ?Review of Systems  ?Cardiovascular:  Positive for chest pain.  ? ?Physical Exam ?Updated Vital Signs ?BP 132/66 (BP Location: Right Arm)   Pulse 70   Temp 98.9 ?F (37.2 ?C) (Oral)   Resp 18   Ht 5' 3.5" (1.613 m)   Wt 66.7 kg   SpO2 92%   BMI 25.63 kg/m?  ?Physical Exam ?Vitals and nursing note reviewed.  ?Constitutional:   ?   Appearance: She is well-developed.  ?HENT:  ?   Head: Normocephalic and atraumatic.  ?Cardiovascular:  ?   Rate and Rhythm: Normal rate and regular rhythm.  ?Pulmonary:  ?   Effort: No respiratory distress.  ?   Breath sounds: No stridor.  ?Abdominal:  ?    General: There is no distension or abdominal bruit.  ?   Palpations: Abdomen is soft. There is no splenomegaly.  ?   Tenderness: There is no abdominal tenderness.  ?Musculoskeletal:     ?   General: Normal range of motion.  ?   Cervical back: Normal range of motion.  ?   Right lower leg: No edema.  ?   Left lower leg: No edema.  ?Neurological:  ?   Mental Status: She is alert.  ? ? ?ED Results / Procedures / Treatments   ?Labs ?(all labs ordered are listed, but only abnormal results are displayed) ?Labs Reviewed  ?BASIC METABOLIC PANEL - Abnormal; Notable for the following components:  ?    Result Value  ? Glucose, Bld 156 (*)   ? Creatinine, Ser 1.42 (*)   ? GFR, Estimated 38 (*)   ? All other components within normal limits  ?CBC - Abnormal; Notable for the following components:  ? WBC 12.5 (*)   ? All other components within normal limits  ?URINALYSIS, ROUTINE W REFLEX MICROSCOPIC - Abnormal; Notable for the following components:  ? Color, Urine AMBER (*)   ? Leukocytes,Ua LARGE (*)   ? WBC, UA >50 (*)   ? Bacteria, UA MANY (*)   ? All other components within normal limits  ?HEPATIC FUNCTION PANEL - Abnormal; Notable for the following components:  ? AST 448 (*)   ? ALT 468 (*)   ? Alkaline Phosphatase 196 (*)   ? Total Bilirubin 3.9 (*)   ? Bilirubin, Direct 2.0 (*)   ? Indirect Bilirubin 1.9 (*)   ? All other components within normal limits  ?URINE CULTURE  ?MAGNESIUM  ?TROPONIN I (HIGH SENSITIVITY)  ?TROPONIN I (HIGH SENSITIVITY)  ? ? ?EKG ?EKG Interpretation ? ?Date/Time:  Sunday Aug 22 2021 17:17:15 EDT ?Ventricular Rate:  96 ?PR Interval:  146 ?QRS Duration: 72 ?QT Interval:  366 ?QTC Calculation: 462 ?R Axis:   70 ?Text Interpretation: Sinus rhythm with frequent and consecutive Premature ventricular complexes Abnormal ECG When compared with ECG of 28-Oct-2010 09:03, PREVIOUS ECG IS PRESENT Confirmed by Merrily Pew [phone]) on 08/23/2021 1:27:28 AM ? ?Radiology ?DG Chest 2 View ? ?Result Date:  08/22/2021 ?CLINICAL DATA:  Chest pain EXAM: CHEST - 2 VIEW COMPARISON:  08/15/2014 FINDINGS: Frontal and lateral views of the chest demonstrate an unremarkable cardiac silhouette. No airspace disease, effusion, or pneumothorax. 7 mm lingular nodules unchanged since CTs dating to 2014, benign. No acute bony abnormalities. IMPRESSION: 1. No acute intrathoracic process. Electronically Signed   By: Randa Ngo M.D.   On: 08/22/2021 18:41  ? ?CT Angio Chest/Abd/Pel for Dissection W and/or Wo Contrast ? ?Result Date: 08/23/2021 ?CLINICAL DATA:  Chest  and abdominal pain EXAM: CT ANGIOGRAPHY CHEST, ABDOMEN AND PELVIS TECHNIQUE: Non-contrast CT of the chest was initially obtained. Multidetector CT imaging through the chest, abdomen and pelvis was performed using the standard protocol during bolus administration of intravenous contrast. Multiplanar reconstructed images and MIPs were obtained and reviewed to evaluate the vascular anatomy. RADIATION DOSE REDUCTION: This exam was performed according to the departmental dose-optimization program which includes automated exposure control, adjustment of the mA and/or kV according to patient size and/or use of iterative reconstruction technique. CONTRAST:  35m OMNIPAQUE IOHEXOL 350 MG/ML SOLN COMPARISON:  08/16/2014 FINDINGS: CTA CHEST FINDINGS Cardiovascular: Initial precontrast images demonstrate diffuse atherosclerotic calcifications without aneurysmal dilatation. Post-contrast images also demonstrate atherosclerotic calcifications. No aneurysmal dilatation or dissection is noted. No cardiac enlargement is seen. Coronary calcifications are noted. The pulmonary artery is within normal limits bilaterally. Mediastinum/Nodes: Thoracic inlet is unremarkable. No hilar or mediastinal adenopathy is noted. The esophagus as visualized is within normal limits. Lungs/Pleura: The lungs are well aerated. Scattered pulmonary nodules are noted bilaterally measuring up to 7 mm in size in the  left upper lobe on image number 33 of series 6. No focal infiltrate or sizable effusion is seen. Musculoskeletal: Degenerative changes of the thoracic spine are noted. Review of the MIP images confirms the above findings. CTA AB

## 2021-08-24 ENCOUNTER — Inpatient Hospital Stay (HOSPITAL_COMMUNITY): Payer: Medicare HMO

## 2021-08-24 ENCOUNTER — Inpatient Hospital Stay (HOSPITAL_COMMUNITY): Payer: Medicare HMO | Admitting: Certified Registered Nurse Anesthetist

## 2021-08-24 ENCOUNTER — Encounter (HOSPITAL_COMMUNITY): Payer: Self-pay | Admitting: Internal Medicine

## 2021-08-24 ENCOUNTER — Encounter (HOSPITAL_COMMUNITY)
Admission: EM | Disposition: A | Discharge status: Home / Self Care | Payer: Self-pay | Source: Home / Self Care | Attending: Emergency Medicine

## 2021-08-24 DIAGNOSIS — R748 Abnormal levels of other serum enzymes: Secondary | ICD-10-CM

## 2021-08-24 DIAGNOSIS — E119 Type 2 diabetes mellitus without complications: Secondary | ICD-10-CM | POA: Diagnosis not present

## 2021-08-24 DIAGNOSIS — K8050 Calculus of bile duct without cholangitis or cholecystitis without obstruction: Secondary | ICD-10-CM

## 2021-08-24 DIAGNOSIS — J449 Chronic obstructive pulmonary disease, unspecified: Secondary | ICD-10-CM | POA: Diagnosis not present

## 2021-08-24 DIAGNOSIS — R933 Abnormal findings on diagnostic imaging of other parts of digestive tract: Secondary | ICD-10-CM

## 2021-08-24 DIAGNOSIS — F17210 Nicotine dependence, cigarettes, uncomplicated: Secondary | ICD-10-CM

## 2021-08-24 DIAGNOSIS — R7989 Other specified abnormal findings of blood chemistry: Secondary | ICD-10-CM

## 2021-08-24 DIAGNOSIS — E039 Hypothyroidism, unspecified: Secondary | ICD-10-CM

## 2021-08-24 DIAGNOSIS — I10 Essential (primary) hypertension: Secondary | ICD-10-CM

## 2021-08-24 DIAGNOSIS — Z9049 Acquired absence of other specified parts of digestive tract: Secondary | ICD-10-CM | POA: Diagnosis not present

## 2021-08-24 DIAGNOSIS — N1831 Chronic kidney disease, stage 3a: Secondary | ICD-10-CM

## 2021-08-24 HISTORY — PX: SPHINCTEROTOMY: SHX5279

## 2021-08-24 HISTORY — PX: REMOVAL OF STONES: SHX5545

## 2021-08-24 HISTORY — PX: ENDOSCOPIC RETROGRADE CHOLANGIOPANCREATOGRAPHY (ERCP) WITH PROPOFOL: SHX5810

## 2021-08-24 LAB — COMPREHENSIVE METABOLIC PANEL
ALT: 424 U/L — ABNORMAL HIGH (ref 0–44)
AST: 242 U/L — ABNORMAL HIGH (ref 15–41)
Albumin: 3.5 g/dL (ref 3.5–5.0)
Alkaline Phosphatase: 196 U/L — ABNORMAL HIGH (ref 38–126)
Anion gap: 9 (ref 5–15)
BUN: 15 mg/dL (ref 8–23)
CO2: 25 mmol/L (ref 22–32)
Calcium: 8.6 mg/dL — ABNORMAL LOW (ref 8.9–10.3)
Chloride: 106 mmol/L (ref 98–111)
Creatinine, Ser: 0.89 mg/dL (ref 0.44–1.00)
GFR, Estimated: >60 mL/min (ref >60)
Glucose, Bld: 89 mg/dL (ref 70–99)
Potassium: 3.6 mmol/L (ref 3.5–5.1)
Sodium: 140 mmol/L (ref 135–145)
Total Bilirubin: 1.8 mg/dL — ABNORMAL HIGH (ref 0.3–1.2)
Total Protein: 6.2 g/dL — ABNORMAL LOW (ref 6.5–8.1)

## 2021-08-24 LAB — GLUCOSE, CAPILLARY
Glucose-Capillary: 100 mg/dL — ABNORMAL HIGH (ref 70–99)
Glucose-Capillary: 101 mg/dL — ABNORMAL HIGH (ref 70–99)
Glucose-Capillary: 105 mg/dL — ABNORMAL HIGH (ref 70–99)
Glucose-Capillary: 301 mg/dL — ABNORMAL HIGH (ref 70–99)
Glucose-Capillary: 110 mg/dL — ABNORMAL HIGH (ref 70–99)

## 2021-08-24 LAB — CBC WITH DIFFERENTIAL/PLATELET
Abs Immature Granulocytes: 0.02 10*3/uL (ref 0.00–0.07)
Basophils Absolute: 0.1 10*3/uL (ref 0.0–0.1)
Basophils Relative: 1 %
Eosinophils Absolute: 0.3 10*3/uL (ref 0.0–0.5)
Eosinophils Relative: 4 %
HCT: 39.9 % (ref 36.0–46.0)
Hemoglobin: 13.1 g/dL (ref 12.0–15.0)
Immature Granulocytes: 0 %
Lymphocytes Relative: 22 %
Lymphs Abs: 1.5 10*3/uL (ref 0.7–4.0)
MCH: 29.8 pg (ref 26.0–34.0)
MCHC: 32.8 g/dL (ref 30.0–36.0)
MCV: 90.9 fL (ref 80.0–100.0)
Monocytes Absolute: 0.4 10*3/uL (ref 0.1–1.0)
Monocytes Relative: 6 %
Neutro Abs: 4.5 10*3/uL (ref 1.7–7.7)
Neutrophils Relative %: 67 %
Platelets: 187 10*3/uL (ref 150–400)
RBC: 4.39 MIL/uL (ref 3.87–5.11)
RDW: 14.3 % (ref 11.5–15.5)
WBC: 6.7 10*3/uL (ref 4.0–10.5)
nRBC: 0 % (ref 0.0–0.2)

## 2021-08-24 SURGERY — ENDOSCOPIC RETROGRADE CHOLANGIOPANCREATOGRAPHY (ERCP) WITH PROPOFOL
Anesthesia: General

## 2021-08-24 MED ORDER — SODIUM CHLORIDE 0.9 % IV SOLN
INTRAVENOUS | Status: DC | PRN
  Administered 2021-08-24: 25 mL

## 2021-08-24 MED ORDER — DICLOFENAC SUPPOSITORY 100 MG: 100.0000 mg | Freq: Once | RECTAL | Status: DC

## 2021-08-24 MED ORDER — LIDOCAINE HCL (CARDIAC) PF 100 MG/5ML IV SOSY
PREFILLED_SYRINGE | INTRAVENOUS | Status: DC | PRN
  Administered 2021-08-24: 80 mg via INTRAVENOUS

## 2021-08-24 MED ORDER — PROPOFOL 1000 MG/100ML IV EMUL
INTRAVENOUS | Status: AC
  Filled 2021-08-24: qty 100

## 2021-08-24 MED ORDER — SODIUM CHLORIDE 0.9 % IV SOLN
3.0000 g | Freq: Once | INTRAVENOUS | Status: AC
  Administered 2021-08-24: 3 g via INTRAVENOUS
  Filled 2021-08-24: qty 8

## 2021-08-24 MED ORDER — GLUCAGON HCL RDNA (DIAGNOSTIC) 1 MG IJ SOLR
INTRAMUSCULAR | Status: DC | PRN
  Administered 2021-08-24 (×2): .25 mg via INTRAVENOUS

## 2021-08-24 MED ORDER — PROPOFOL 10 MG/ML IV BOLUS
INTRAVENOUS | Status: DC | PRN
  Administered 2021-08-24: 180 mg via INTRAVENOUS

## 2021-08-24 MED ORDER — FENTANYL CITRATE (PF) 100 MCG/2ML IJ SOLN
INTRAMUSCULAR | Status: AC
  Filled 2021-08-24: qty 2

## 2021-08-24 MED ORDER — FENTANYL CITRATE (PF) 100 MCG/2ML IJ SOLN
INTRAMUSCULAR | Status: DC | PRN
  Administered 2021-08-24: 50 ug via INTRAVENOUS

## 2021-08-24 MED ORDER — ROCURONIUM BROMIDE 100 MG/10ML IV SOLN
INTRAVENOUS | Status: DC | PRN
  Administered 2021-08-24: 50 mg via INTRAVENOUS

## 2021-08-24 MED ORDER — PHENYLEPHRINE 80 MCG/ML (10ML) SYRINGE FOR IV PUSH (FOR BLOOD PRESSURE SUPPORT)
PREFILLED_SYRINGE | INTRAVENOUS | Status: DC | PRN
  Administered 2021-08-24: 160 ug via INTRAVENOUS

## 2021-08-24 MED ORDER — GLUCAGON HCL RDNA (DIAGNOSTIC) 1 MG IJ SOLR
INTRAMUSCULAR | Status: AC
  Filled 2021-08-24: qty 1

## 2021-08-24 MED ORDER — SUGAMMADEX SODIUM 200 MG/2ML IV SOLN
INTRAVENOUS | Status: DC | PRN
  Administered 2021-08-24: 200 mg via INTRAVENOUS

## 2021-08-24 MED ORDER — ONDANSETRON HCL 4 MG/2ML IJ SOLN
INTRAMUSCULAR | Status: DC | PRN
  Administered 2021-08-24: 4 mg via INTRAVENOUS

## 2021-08-24 MED ORDER — DEXAMETHASONE SODIUM PHOSPHATE 10 MG/ML IJ SOLN
INTRAMUSCULAR | Status: DC | PRN
  Administered 2021-08-24: 10 mg via INTRAVENOUS

## 2021-08-24 MED ORDER — DICLOFENAC SUPPOSITORY 100 MG
RECTAL | Status: DC | PRN
  Administered 2021-08-24: 100 mg via RECTAL

## 2021-08-24 MED ORDER — PROPOFOL 10 MG/ML IV BOLUS
INTRAVENOUS | Status: AC
  Filled 2021-08-24: qty 20

## 2021-08-24 MED ORDER — DICLOFENAC SUPPOSITORY 100 MG
RECTAL | Status: AC
  Filled 2021-08-24: qty 1

## 2021-08-24 MED ORDER — PROPOFOL 500 MG/50ML IV EMUL
INTRAVENOUS | Status: AC
  Filled 2021-08-24: qty 100

## 2021-08-24 MED ORDER — LACTATED RINGERS IV SOLN: INTRAVENOUS | Status: DC

## 2021-08-24 NOTE — Transfer of Care (Signed)
Immediate Anesthesia Transfer of Care Note ? ?Patient: Erika Mason ? ?Procedure(s) Performed: ENDOSCOPIC RETROGRADE CHOLANGIOPANCREATOGRAPHY (ERCP) WITH PROPOFOL ?SPHINCTEROTOMY ?REMOVAL OF STONES ? ?Patient Location: PACU and Endoscopy Unit ? ?Anesthesia Type:General ? ?Level of Consciousness: awake, alert  and oriented ? ?Airway & Oxygen Therapy: Patient Spontanous Breathing and Patient connected to face mask oxygen ? ?Post-op Assessment: Report given to RN and Post -op Vital signs reviewed and stable ? ?Post vital signs: Reviewed and stable ? ?Last Vitals:  ?Vitals Value Taken Time  ?BP 224/173 08/24/21 0954  ?Temp    ?Pulse 79 08/24/21 0957  ?Resp 15 08/24/21 0957  ?SpO2 98 % 08/24/21 0957  ?Vitals shown include unvalidated device data. ? ?Last Pain:  ?Vitals:  ? 08/24/21 0950  ?TempSrc: Temporal  ?PainSc: 0-No pain  ?   ? ?  ? ?Complications: No notable events documented. ?

## 2021-08-24 NOTE — Care Management Obs Status (Signed)
MEDICARE OBSERVATION STATUS NOTIFICATION ? ? ?Patient Details  ?Name: Erika Mason ?MRN: [id number] ?Date of Birth: 1945-06-30 ? ? ?Medicare Observation Status Notification Given:  Yes ? ? ? ?Ross Ludwig, LCSW ?08/24/2021, 3:01 PM ?

## 2021-08-24 NOTE — Anesthesia Preprocedure Evaluation (Addendum)
Anesthesia Evaluation  ?Patient identified by MRN, date of birth, ID band ?Patient awake ? ? ? ?Reviewed: ?Allergy & Precautions, NPO status , Patient's Chart, lab work & pertinent test results ? ?History of Anesthesia Complications ?(+) PONV and history of anesthetic complications ? ?Airway ?Mallampati: II ? ?TM Distance: >3 FB ?Neck ROM: Full ? ? ? Dental ? ?(+) Teeth Intact, Dental Advisory Given ?  ?Pulmonary ?COPD,  COPD inhaler, Current Smoker,  ?  ?breath sounds clear to auscultation ? ? ? ? ? ? Cardiovascular ?hypertension, Pt. on medications ? ?Rhythm:Regular Rate:Normal ? ? ?  ?Neuro/Psych ?negative neurological ROS ? negative psych ROS  ? GI/Hepatic ?Neg liver ROS, GERD  Medicated,  ?Endo/Other  ?diabetes, Type 2, Oral Hypoglycemic AgentsHypothyroidism  ? Renal/GU ?Renal disease  ? ?  ?Musculoskeletal ? ? Abdominal ?Normal abdominal exam  (+)   ?Peds ? Hematology ?  ?Anesthesia Other Findings ? ? Reproductive/Obstetrics ? ?  ? ? ? ? ? ? ? ? ? ? ? ? ? ?  ?  ? ? ? ? ? ? ? ?Anesthesia Physical ?Anesthesia Plan ? ?ASA: 3 ? ?Anesthesia Plan: General  ? ?Post-op Pain Management:   ? ?Induction: Intravenous ? ?PONV Risk Score and Plan: 3 and Ondansetron ? ?Airway Management Planned: Oral ETT ? ?Additional Equipment: None ? ?Intra-op Plan:  ? ?Post-operative Plan: Extubation in OR ? ?Informed Consent: I have reviewed the patients History and Physical, chart, labs and discussed the procedure including the risks, benefits and alternatives for the proposed anesthesia with the patient or authorized representative who has indicated his/her understanding and acceptance.  ? ? ? ?Dental advisory given ? ?Plan Discussed with: CRNA ? ?Anesthesia Plan Comments:   ? ? ? ? ? ?Anesthesia Quick Evaluation ? ?

## 2021-08-24 NOTE — Op Note (Signed)
[facility] ?Patient Name: Erika Mason ?Procedure Date: 08/24/2021 ?MRN: [id number] ?Attending MD: Ladene Artist , MD ?Date of Birth: [birth year] ?CSN: [id number] ?Age: 76 ?Admit Type: Inpatient ?Procedure:                ERCP ?Indications:              Bile duct stone(s), Abnormal MRCP, Elevated liver  ?                          enzymes ?Providers:                Pricilla Riffle. Fuller Plan, MD, Jeanella Cara, RN,  ?                          Benetta Spar, Technician ?Referring MD:             [facility] ?Medicines:                General Anesthesia ?Complications:            No immediate complications. ?Estimated Blood Loss:     Estimated blood loss: none. ?Procedure:                Pre-Anesthesia Assessment: ?                          - Prior to the procedure, a History and Physical  ?                          was performed, and patient medications and  ?                          allergies were reviewed. The patient's tolerance of  ?                          previous anesthesia was also reviewed. The risks  ?                          and benefits of the procedure and the sedation  ?                          options and risks were discussed with the patient.  ?                          All questions were answered, and informed consent  ?                          was obtained. Prior Anticoagulants: The patient has  ?                          taken no previous anticoagulant or antiplatelet  ?                          agents. ASA Grade Assessment: III - A patient with  ?                          severe systemic disease.  After reviewing the risks  ?                          and benefits, the patient was deemed in  ?                          satisfactory condition to undergo the procedure. ?                          After obtaining informed consent, the scope was  ?                          passed under direct vision. Throughout the  ?                          procedure, the patient's blood pressure,  pulse, and  ?                          oxygen saturations were monitored continuously. The  ?                          [id number]-Q190V ([id number]) Olympus duodenoscope was  ?                          introduced through the mouth, and used to inject  ?                          contrast into and used to inject contrast into the  ?                          bile duct. The ERCP was accomplished without  ?                          difficulty. The patient tolerated the procedure  ?                          well. ?Scope In: ?Scope Out: ?Findings: ?     A scout film of the abdomen was obtained. Surgical clips, consistent  ?     with a previous cholecystectomy, were seen in the area of the right  ?     upper quadrant of the abdomen. The esophagus was successfully intubated  ?     under direct vision. The scope was advanced to a normal major papilla in  ?     the descending duodenum without detailed examination of the pharynx,  ?     larynx and associated structures, and upper GI tract. The upper GI tract  ?     was grossly normal. A straight Roadrunner wire was passed into the  ?     biliary tree. The short-nosed traction sphincterotome was passed over  ?     the guidewire and the bile duct was then deeply cannulated. Contrast was  ?     injected. I personally interpreted the bile duct images. There was  ?     appropriate flow of contrast through the ducts. The common bile duct  ?     contained  one stone, which was 7 mm in diameter. A cholecystectomy had  ?     been performed. The biliary tree otherwise appeared normal without  ?     ductal dilation. A 7 mm biliary sphincterotomy was made with a traction  ?     (standard) sphincterotome using ERBE electrocautery. There was no  ?     post-sphincterotomy bleeding. The biliary tree was swept with a 9 mm  ?     balloon starting at the bifurcation several times. Sludge was swept from  ?     the duct. One stone was removed. No stones or sludge remained. The final  ?     occlusion  cholangiogram was free of filling defects with good biliary  ?     drainage. The PD was not cannulated or injected by intention. ?Impression:               - Prior cholecystectomy. ?                          - Choledocholithiasis was found. Complete removal  ?                          was accomplished by biliary sphincterotomy and  ?                          balloon extraction. ?                          - A biliary sphincterotomy was performed. ?                          - The biliary tree was swept. ?Moderate Sedation: ?     Not Applicable - Patient had care per Anesthesia. ?Recommendation:           - Return patient to hospital ward for ongoing care. ?                          - Avoid aspirin and nonsteroidal anti-inflammatory  ?                          medicines for 1 week. ?                          - Observe patient's clinical course following  ?                          today's ERCP with therapeutic intervention. ?                          - Clear liquids 4 hours and advance diet later  ?                          today as tolerated. ?                          - Trend LFTs. ?Procedure Code(s):        --- Professional --- ?  88280, Endoscopic retrograde  ?                          cholangiopancreatography (ERCP); with removal of  ?                          calculi/debris from biliary/pancreatic duct(s) ?                          [id number], Endoscopic retrograde  ?                          cholangiopancreatography (ERCP); with  ?                          sphincterotomy/papillotomy ?Diagnosis Code(s):        --- Professional --- ?                          Z90.49, Acquired absence of other specified parts  ?                          of digestive tract ?                          K80.50, Calculus of bile duct without cholangitis  ?                          or cholecystitis without obstruction ?                          R74.8, Abnormal levels of other serum enzymes ?                           R93.2, Abnormal findings on diagnostic imaging of  ?                          liver and biliary tract ?CPT copyright 2019 American Medical Association. All rights reserved. ?The codes documented in this report are preliminary and upon coder review may  ?be revised to meet current compliance requirements. ?Ladene Artist, MD ?08/24/2021 9:46:39 AM ?This report has been signed electronically. ?Number of Addenda: 0 ?

## 2021-08-24 NOTE — Progress Notes (Signed)
[facility] ? ?PROGRESS NOTE ? ?Erika Mason MVH:[id number] DOB: Jun 25, 1945 DOA: 08/22/2021 ?PCP: Pcp, No ? ? ?Brief HPI:   ?76 year old female with medical history of COPD, diabetes mellitus type 2, hypertension, hyperlipidemia, hypothyroidism presented with chest pain.  CT abdomen/pelvis showed fatty infiltration of liver with finding suggestive of choledocholithiasis in the distal common bile duct.  Also showed stable bilateral adrenal adenomas, prominent ovaries measuring up to 4.8 cm on the left. ?Gastroenterology was consulted ? ?Patient underwent MRCP which showed solitary 9 x 3 mm choledocho with in the lower third of CBD.  Also showed 7 mm  pulmonary nodule ? ? ?Subjective  ? ?Patient is s/p ERCP with biliary sphincterotomy and removal of common bile duct stone.  Denies pain at this time. ? ? Assessment/Plan:  ? ?Choledocholithiasis ?-S/p ERCP with removal of the CBD stone ?-Patient has history of prior cholecystectomy ?-CT showed choledocholithiasis which was confirmed by MRCP ?-GI following ? ?Diabetes mellitus type 2 ?-Metformin on hold ?-Continue sliding scale insulin with NovoLog ?-CBG well controlled ? ?Stage IIIa CKD ?-Creatinine improved to 0.89 today ? ?Transaminitis ?-Secondary to CBD stone as above ?-LFTs are improving ? ?Pancreatic cyst ?-MRCP shows benign-appearing pancreatic cyst ?-Recommend to follow-up MRI in 2 years ? ?Enlarged ovaries ?-Seen on CT abdomen/pelvis ?-MRI does not show enlarged ovaries ?-Patient will need  pelvic ultrasound as outpatient shortly after discharge to rule out malignancy ?-Discussed with patient's daughter at bedside ? ?Pulmonary nodule ?-CT chest shows 7 mm pulm nodule ?-Recommend noncontrast CT chest as outpatient in 3 to 6 months ? ?Hypothyroidism ?-Continue Synthroid ? ?Abnormal UA ?-No urinary symptoms ?-Likely asymptomatic bacteriuria ?-Follow urine culture ? ? ? ?Medications ? ?  ? aspirin  81 mg Oral Daily  ? budesonide (PULMICORT) nebulizer  solution  0.5 mg Nebulization BID  ? diclofenac  100 mg Rectal Once  ? insulin aspart  0-15 Units Subcutaneous TID WC  ? insulin aspart  0-5 Units Subcutaneous QHS  ? levothyroxine  88 mcg Oral QAC breakfast  ? pantoprazole  40 mg Oral Daily  ? ? ? Data Reviewed:  ? ?CBG: ? ?Recent Labs  ?Lab 08/23/21 ?1714 08/23/21 ?2050 08/24/21 ?0750 08/24/21 ?8413 08/24/21 ?1139  ?GLUCAP 170* 112* 100* 101* 105*  ? ? ?SpO2: 94 % ?O2 Flow Rate (L/min): 4 L/min  ? ? ?Vitals:  ? 08/24/21 1000 08/24/21 1010 08/24/21 1020 08/24/21 1037  ?BP: 123/70 (!) 107/46 116/60 131/63  ?Pulse: 80 79 77 75  ?Resp: '18 18 19 18  '$ ?Temp:    98.3 ?F (36.8 ?C)  ?TempSrc:      ?SpO2: 100% 95% 98% 94%  ?Weight:      ?Height:      ? ? ? ? ?Data Reviewed: ? ?Basic Metabolic Panel: ?Recent Labs  ?Lab 08/22/21 ?1813 08/23/21 ?2440 08/24/21 ?1027  ?NA 135  --  140  ?K 3.8  --  3.6  ?CL 98  --  106  ?CO2 25  --  25  ?GLUCOSE 156*  --  89  ?BUN 21  --  15  ?CREATININE 1.42*  --  0.89  ?CALCIUM 9.7  --  8.6*  ?MG  --  1.7  --   ? ? ?CBC: ?Recent Labs  ?Lab 08/22/21 ?1813 08/24/21 ?2536  ?WBC 12.5* 6.7  ?NEUTROABS  --  4.5  ?HGB 14.1 13.1  ?HCT 42.8 39.9  ?MCV 90.7 90.9  ?PLT 259 187  ? ? ?LFT ?Recent Labs  ?Lab 08/23/21 ?6440 08/24/21 ?  4982  ?AST 448* 242*  ?ALT 468* 424*  ?ALKPHOS 196* 196*  ?BILITOT 3.9* 1.8*  ?PROT 6.5 6.2*  ?ALBUMIN 3.7 3.5  ? ?  ?Antibiotics: ?Anti-infectives (From admission, onward)  ? ? Start     Dose/Rate Route Frequency Ordered Stop  ? 08/24/21 0815  Ampicillin-Sulbactam (UNASYN) 3 g in sodium chloride 0.9 % 100 mL IVPB       ? 3 g ?200 mL/hr over 30 Minutes Intravenous  Once 08/24/21 0803 08/24/21 0903  ? 08/23/21 0615  cefTRIAXone (ROCEPHIN) 2 g in sodium chloride 0.9 % 100 mL IVPB       ? 2 g ?200 mL/hr over 30 Minutes Intravenous  Once 08/23/21 6415 08/23/21 0721  ? ?  ? ? ? ?DVT prophylaxis: SCDs ? ?Code Status: Full code ? ?Family Communication: No family at bedside ? ? ?CONSULTS gastroenterology ? ? ?Objective  ? ? ?Physical  Examination: ? ? ?General-appears in no acute distress ?Heart-S1-S2, regular, no murmur auscultated ?Lungs-clear to auscultation bilaterally, no wheezing or crackles auscultated ?Abdomen-soft, nontender, no organomegaly ?Extremities-no edema in the lower extremities ?Neuro-alert, oriented x3, no focal deficit noted ? ? ?Status is: Inpatient: Choledocholithiasis ? ? ? ?  ? ? ?Oswald Hillock ?  ?[facility] ?If 7PM-7AM, please contact night-coverage at [email or web address], ?Office  [phone] ? ? ?08/24/2021, 4:10 PM  LOS: 1 day  ? ? ? ? ? ? ? ? ? ? ?  ?

## 2021-08-24 NOTE — Anesthesia Procedure Notes (Signed)
Procedure Name: Intubation ?Date/Time: 08/24/2021 8:54 AM ?Performed by: [place] Zackaria Burkey C, CRNA ?Pre-anesthesia Checklist: Patient identified, Emergency Drugs available, Suction available and Patient being monitored ?Patient Re-evaluated:Patient Re-evaluated prior to induction ?Oxygen Delivery Method: Circle system utilized ?Preoxygenation: Pre-oxygenation with 100% oxygen ?Induction Type: IV induction ?Ventilation: Mask ventilation without difficulty ?Laryngoscope Size: Mac and 3 ?Grade View: Grade II ?Tube type: Oral ?Tube size: 7.0 mm ?Number of attempts: 1 ?Airway Equipment and Method: Stylet and Oral airway ?Placement Confirmation: ETT inserted through vocal cords under direct vision, positive ETCO2 and breath sounds checked- equal and bilateral ?Secured at: 20 cm ?Tube secured with: Tape ?Dental Injury: Teeth and Oropharynx as per pre-operative assessment  ? ? ? ? ?

## 2021-08-24 NOTE — Interval H&P Note (Signed)
History and Physical Interval Note: ? ?08/24/2021 ?8:39 AM ? ?Erika Mason  has presented today for surgery, with the diagnosis of stones in CBD stone..  The various methods of treatment have been discussed with the patient and family. After consideration of risks, benefits and other options for treatment, the patient has consented to  Procedure(s): ?ENDOSCOPIC RETROGRADE CHOLANGIOPANCREATOGRAPHY (ERCP) WITH PROPOFOL (N/A) as a surgical intervention.  The patient's history has been reviewed, patient examined, no change in status, stable for surgery.  I have reviewed the patient's chart and labs.  Questions were answered to the patient's satisfaction.   ? ? ?Pricilla Riffle. Fuller Plan ? ? ?

## 2021-08-24 NOTE — Care Management CC44 (Signed)
Condition Code 44 Documentation Completed ? ?Patient Details  ?Name: Erika Mason ?MRN: [id number] ?Date of Birth: Jan 24, 1946 ? ? ?Condition Code 44 given:  Yes ?Patient signature on Condition Code 44 notice:  Yes ?Documentation of 2 MD's agreement:  Yes ?Code 44 added to claim:  Yes ? ? ? ?Ross Ludwig, LCSW ?08/24/2021, 3:01 PM ? ?

## 2021-08-24 NOTE — Anesthesia Postprocedure Evaluation (Signed)
Anesthesia Post Note ? ?Patient: Luvina Poirier Ballo ? ?Procedure(s) Performed: ENDOSCOPIC RETROGRADE CHOLANGIOPANCREATOGRAPHY (ERCP) WITH PROPOFOL ?SPHINCTEROTOMY ?REMOVAL OF STONES ? ?  ? ?Patient location during evaluation: PACU ?Anesthesia Type: General ?Level of consciousness: awake and alert ?Pain management: pain level controlled ?Vital Signs Assessment: post-procedure vital signs reviewed and stable ?Respiratory status: spontaneous breathing, nonlabored ventilation, respiratory function stable and patient connected to nasal cannula oxygen ?Cardiovascular status: blood pressure returned to baseline and stable ?Postop Assessment: no apparent nausea or vomiting ?Anesthetic complications: no ? ? ?No notable events documented. ? ?Last Vitals:  ?Vitals:  ? 08/24/21 1020 08/24/21 1037  ?BP: 116/60 131/63  ?Pulse: 77 75  ?Resp: 19 18  ?Temp:  36.8 ?C  ?SpO2: 98% 94%  ?  ?Last Pain:  ?Vitals:  ? 08/24/21 1037  ?TempSrc:   ?PainSc: 0-No pain  ? ? ?  ?  ?  ?  ?  ?  ? ?Effie Berkshire ? ? ? ? ?

## 2021-08-25 ENCOUNTER — Encounter (HOSPITAL_COMMUNITY): Payer: Self-pay | Admitting: Gastroenterology

## 2021-08-25 ENCOUNTER — Observation Stay (HOSPITAL_COMMUNITY): Payer: Medicare HMO

## 2021-08-25 DIAGNOSIS — N1831 Chronic kidney disease, stage 3a: Secondary | ICD-10-CM | POA: Diagnosis not present

## 2021-08-25 DIAGNOSIS — R933 Abnormal findings on diagnostic imaging of other parts of digestive tract: Secondary | ICD-10-CM | POA: Diagnosis not present

## 2021-08-25 DIAGNOSIS — E119 Type 2 diabetes mellitus without complications: Secondary | ICD-10-CM | POA: Diagnosis not present

## 2021-08-25 DIAGNOSIS — R918 Other nonspecific abnormal finding of lung field: Secondary | ICD-10-CM

## 2021-08-25 DIAGNOSIS — I10 Essential (primary) hypertension: Secondary | ICD-10-CM

## 2021-08-25 DIAGNOSIS — K8050 Calculus of bile duct without cholangitis or cholecystitis without obstruction: Secondary | ICD-10-CM | POA: Diagnosis not present

## 2021-08-25 LAB — COMPREHENSIVE METABOLIC PANEL
ALT: 357 U/L — ABNORMAL HIGH (ref 0–44)
AST: 137 U/L — ABNORMAL HIGH (ref 15–41)
Albumin: 3.4 g/dL — ABNORMAL LOW (ref 3.5–5.0)
Alkaline Phosphatase: 183 U/L — ABNORMAL HIGH (ref 38–126)
Anion gap: 7 (ref 5–15)
BUN: 12 mg/dL (ref 8–23)
CO2: 26 mmol/L (ref 22–32)
Calcium: 8.7 mg/dL — ABNORMAL LOW (ref 8.9–10.3)
Chloride: 107 mmol/L (ref 98–111)
Creatinine, Ser: 0.88 mg/dL (ref 0.44–1.00)
GFR, Estimated: >60 mL/min (ref >60)
Glucose, Bld: 107 mg/dL — ABNORMAL HIGH (ref 70–99)
Potassium: 3.6 mmol/L (ref 3.5–5.1)
Sodium: 140 mmol/L (ref 135–145)
Total Bilirubin: 1.5 mg/dL — ABNORMAL HIGH (ref 0.3–1.2)
Total Protein: 6 g/dL — ABNORMAL LOW (ref 6.5–8.1)

## 2021-08-25 LAB — CBC
HCT: 38.5 % (ref 36.0–46.0)
Hemoglobin: 12.7 g/dL (ref 12.0–15.0)
MCH: 29.8 pg (ref 26.0–34.0)
MCHC: 33 g/dL (ref 30.0–36.0)
MCV: 90.4 fL (ref 80.0–100.0)
Platelets: 184 10*3/uL (ref 150–400)
RBC: 4.26 MIL/uL (ref 3.87–5.11)
RDW: 14.4 % (ref 11.5–15.5)
WBC: 12.4 10*3/uL — ABNORMAL HIGH (ref 4.0–10.5)
nRBC: 0 % (ref 0.0–0.2)

## 2021-08-25 LAB — URINE CULTURE: Culture: 50000 — AB

## 2021-08-25 LAB — GLUCOSE, CAPILLARY
Glucose-Capillary: 92 mg/dL (ref 70–99)
Glucose-Capillary: 175 mg/dL — ABNORMAL HIGH (ref 70–99)

## 2021-08-25 MED ORDER — LOSARTAN POTASSIUM-HCTZ 50-12.5 MG PO TABS: 1.0000 | ORAL_TABLET | Freq: Every day | ORAL | Status: AC

## 2021-08-25 NOTE — Progress Notes (Signed)
[facility] ?PROGRESS NOTE ? ? ? ?Progress Note  ?Erika Mason  TFT:[id number] DOB: May 09, 1945 DOA: 08/22/2021 ?PCP: Pcp, No  ? ? ? ?Brief Narrative:  ? ?Erika Mason is an 76 y.o. female past medical history of COPD, diabetes mellitus type 2, comes into the ED with chest pain CT scan of the abdomen pelvis showed fatty liver and choledocholithiasis in the distal common bile duct along with stable bilateral adrenal adenomas pulmonary ovaries measuring about 4.8 cm on the left gastroenterology was consulted, underwent MRCP that showed solitary stone in the common bile duct. ? ? ? ?Assessment/Plan:  ? ?Choledocholithiasis: ?Status post ERCP with removal of common bile duct stone and sphincterotomy. ?Further management per GI. ?Allow diet KVO IV fluids. ?Can probably be discharged later on this afternoon. ? ?Diabetes mellitus type 2: ?Continue to hold metformin, patient is currently n.p.o. continue sliding scale insulin, sliding scale insulin. ? ?Acute kidney disease stage IIIa: ?Resolved with fluid resuscitation. ?Likely prerenal. ? ?Pancreatic cyst: ?Seen on MRI benign-appearing will need to follow-up MRI in 2 years. ? ?Enlarged ovary: ?Seen on CT scan of the abdomen and pelvis, MRI showed abnormal bilateral adnexal masses. ?We will get a pelvic ultrasound. ? ?Multiple pulmonary nodule: ?Seen on CT of the chest the largest 7 mm in the upper lobe, repeat noncontrasted CT of the chest in 3 to 6 months. ? ?Hypothyroidism: ?Continue Synthroid. ? ?Abnormal UA: ?Asymptomatic bacteriuria. ? ? ?DVT prophylaxis: lovenox ?Family Communication:none ?Status is: Observation ?The patient will require care spanning > 2 midnights and should be moved to inpatient because: Acute cholecystitis with choledocholithiasis for lap chole on 08/25/2021 ? ? ? ?Code Status:  ? ?  ?Code Status Orders  ?(From admission, onward)  ?  ? ? ?  ? ?  Start     Ordered  ? 08/23/21 0949  Full code  Continuous       ? 08/23/21 0949  ? ?  ?   ? ?  ? ?Code Status History   ? ? Date Active Date Inactive Code Status Order ID Comments User Context  ? 08/15/2014 1525 08/17/2014 1413 Full Code [id number]  Edwyna Ready, MD Inpatient  ? ?  ? ? ? ? ?IV Access:  ? ?Peripheral IV ? ? ?Procedures and diagnostic studies:  ? ?MR 3D Recon At Scanner ? ?Result Date: 08/23/2021 ?CLINICAL DATA:  Choledocholithiasis on CT angiography from earlier today. Cholecystectomy. EXAM: MRI ABDOMEN WITHOUT AND WITH CONTRAST (INCLUDING MRCP) TECHNIQUE: Multiplanar multisequence MR imaging of the abdomen was performed both before and after the administration of intravenous contrast. Heavily T2-weighted images of the biliary and pancreatic ducts were obtained, and three-dimensional MRCP images were rendered by post processing. CONTRAST:  78m GADAVIST GADOBUTROL 1 MMOL/ML IV SOLN COMPARISON:  08/23/2021 CT angiogram of the chest, abdomen and pelvis. FINDINGS: Lower chest: Left upper lobe 7 mm pulmonary nodule (series 11/image 95), unchanged from CT from earlier today. Hepatobiliary: Normal liver size and configuration. Marked diffuse hepatic steatosis. Three scattered right liver hemangiomas with progressive discontinuous peripheral nodular enhancement, largest 1.5 cm in the segment 7 right liver (series 4/image 111). Additional scattered subcentimeter simple liver cysts. No suspicious liver masses. Status post cholecystectomy. Bile ducts are normal in caliber post cholecystectomy. Common bile duct diameter 5 mm. No intrahepatic biliary ductal dilatation. There is a 9 x 3 mm low signal intensity ovoid filling defect in the lower third of the CBD on the MRCP sequence (series 6/image 64), correlating with focal calcification on the  CT study from earlier today, compatible with a choledocholith. No enhancing biliary masses. No biliary beading. Pancreas: Unilocular 1.3 cm uncinate process pancreatic cyst (series 2/image 16) without wall thickening, thickened internal septations or solid  enhancement. No additional pancreatic lesions. No pancreatic duct dilation. No pancreas divisum. Spleen: Normal size. No mass. Adrenals/Urinary Tract: Right adrenal 1.7 cm nodule and left adrenal 1.7 cm nodule, both demonstrating prominent loss of signal intensity on out of phase chemical shift imaging, diagnostic of benign bilateral adrenal adenomas. No hydronephrosis. Benign bilateral simple renal cortical cysts, largest 1.1 cm in the anterior interpolar left kidney, requiring no follow-up. Scattered small bilateral T1 hyperintense renal cortical lesions demonstrating no appreciable enhancement on the postcontrast subtraction sequences, largest 0.9 cm in the posterior upper left kidney (series 1000/image 63), compatible with benign hemorrhagic/proteinaceous Bosniak category 2 renal cortical cysts, requiring no follow-up. No suspicious renal cortical masses. Stomach/Bowel: Normal non-distended stomach. Visualized small and large bowel is normal caliber, with no bowel wall thickening. Moderate left colonic diverticulosis. Vascular/Lymphatic: Atherosclerotic nonaneurysmal abdominal aorta. Patent portal, splenic, hepatic and renal veins. No pathologically enlarged lymph nodes in the abdomen. Other: No abdominal ascites or focal fluid collection. Partial visualization of abnormal bilateral heterogeneous adnexal masses (series 2/image 11). Musculoskeletal: No aggressive appearing focal osseous lesions. IMPRESSION: 1. Solitary 9 x 3 mm choledocholith in the lower third of the CBD. No intrahepatic or extrahepatic biliary ductal dilatation. CBD diameter 5 mm. 2. Partial visualization of abnormal bilateral adnexal masses, better seen on CT angiogram study from earlier today. Ovarian neoplasm not excluded. Pelvic ultrasound correlation recommended on a short term outpatient basis. 3. Unilocular 1.3 cm uncinate process pancreatic cyst without suspicious MRI features. Recommend attention on follow-up MRI abdomen without and  with contrast in two years. This recommendation follows ACR consensus guidelines: Management of Incidental Pancreatic Cysts: A White Paper of the ACR Incidental Findings Committee. J Am Coll Radiol 8469;62:952-841. 4. Marked diffuse hepatic steatosis. 5. Bilateral benign adrenal adenomas, for which no follow-up is recommended. 6. Left upper lobe 7 mm pulmonary nodule, please see the chest CT angiogram report from earlier today for follow-up recommendations. Electronically Signed   By: Ilona Sorrel M.D.   On: 08/23/2021 15:12  ? ?DG ERCP ? ?Result Date: 08/24/2021 ?CLINICAL DATA:  Choledocholithiasis EXAM: ERCP TECHNIQUE: Multiple spot images obtained with the fluoroscopic device and submitted for interpretation post-procedure. FLUOROSCOPY: Radiation Exposure Index (as provided by the fluoroscopic device): 55.3 mGy Kerma COMPARISON:  None Available. FINDINGS: A total of 12 intraoperative saved images are submitted for review. The images demonstrate a flexible duodenal scope in the descending duodenum with wire cannulation of the common bile duct. Initial cholangiogram images demonstrate filling defects in the distal common bile duct consistent with choledocholithiasis. Subsequent images demonstrate sphincterotomy and balloon sweeping of the common duct. IMPRESSION: 1. Choledocholithiasis. 2. ERCP with sphincterotomy and balloon sweeping of the common duct. These images were submitted for radiologic interpretation only. Please see the procedural report for the amount of contrast and the fluoroscopy time utilized. Electronically Signed   By: Jacqulynn Cadet M.D.   On: 08/24/2021 10:10  ? ?MR ABDOMEN MRCP W WO CONTAST ? ?Result Date: 08/23/2021 ?CLINICAL DATA:  Choledocholithiasis on CT angiography from earlier today. Cholecystectomy. EXAM: MRI ABDOMEN WITHOUT AND WITH CONTRAST (INCLUDING MRCP) TECHNIQUE: Multiplanar multisequence MR imaging of the abdomen was performed both before and after the administration of  intravenous contrast. Heavily T2-weighted images of the biliary and pancreatic ducts were obtained, and three-dimensional MRCP images were  rendered by post processing. CONTRAST:  4m GADAVIST GADOBUTROL 1 MMOL/ML IV SOL

## 2021-08-25 NOTE — Discharge Summary (Signed)
Physician Discharge Summary  ?Erika Mason MAU:[id number] DOB: 24-Nov-1945 DOA: 08/22/2021 ? ?PCP: Pcp, No ? ?Admit date: 08/22/2021 ?Discharge date: 08/25/2021 ? ?Admitted From: Home ?Disposition:  Home ? ?Recommendations for Outpatient Follow-up:  ?Follow up with PCP in 1-2 weeks for results order for transvaginal ultrasound. ?Please obtain CMP/CBC in one week ?Follow-up with GI for biopsy results in 2 to 4 weeks. ? ?Home Health:No ?Equipment/Devices:None ? ?Discharge Condition:Stable ?CODE STATUS:Full ?Diet recommendation: Heart Healthy ? ?Brief/Interim Summary: ? 76 y.o. female past medical history of COPD, diabetes mellitus type 2, comes into the ED with chest pain CT scan of the abdomen pelvis showed fatty liver and choledocholithiasis in the distal common bile duct along with stable bilateral adrenal adenomas pulmonary ovaries measuring about 4.8 cm on the left gastroenterology was consulted, underwent MRCP that showed solitary stone in the common bile duct. ? ?Discharge Diagnoses:  ?Principal Problem: ?  Choledocholithiasis ?Active Problems: ?  HTN (hypertension) ?  Hyperlipidemia ?  Hypothyroidism ?  Diabetes mellitus type 2, uncomplicated ([place]) ?  Smoker ?  Chronic kidney disease, stage 3a ([place]) ?  Multiple pulmonary nodules ?  Elevated LFTs ?  Abnormal magnetic resonance cholangiopancreatography (MRCP) ? ?Choledocholithiasis: ?GI was consulted ?She has a history of cholecystectomy , she is status post ERCP on 08/24/2021 with sphincterotomy and common bile duct stone removal. ?Initially she was placed n.p.o. after ERCP she was given a clear liquid diet and advance as tolerated. ?She will follow-up with GI as an outpatient. ? ?Diabetes mellitus type 2 with hyperglycemia: ?Oral hypoglycemic agents were held she was started on sliding scale insulin with good control she will return back to her home regimen. ? ?Acute kidney injury on chronic kidney disease stage IIIa: ?Likely prerenal azotemia resolved with IV  fluid resuscitation. ? ?Pancreatic cyst: ?Seen on MRI it appears benign way to follow-up in 2 years. ? ?Bilateral enlarged ovaries: ?Seen on CT of the abdomen pelvis and confirmed on MRI as bilateral adnexal mass. ?Transvaginal ultrasound was done, but the patient wanted to follow-up the results with her primary care doctor. ? ?Multiple pulmonary nodules: ?Seen on CT the largest measuring about 7 mm in the upper lobe, she will need a repeat noncontrasted CT in 3 to 6 months. ? ?Hypothyroidism: ?Continue Synthroid. ? ?Abnormal UA: ?Likely asymptomatic bacteriuria ? ? ? ?Discharge Instructions ? ?Discharge Instructions   ? ? Diet - low sodium heart healthy   Complete by: As directed ?  ? Increase activity slowly   Complete by: As directed ?  ? ?  ? ?Allergies as of 08/25/2021   ? ?   Reactions  ? Actonel [risedronate Sodium] Other (See Comments)  ? Chest Pain/Severe GERD  ? ?  ? ?  ?Medication List  ?  ? ?TAKE these medications   ? ?albuterol 108 (90 Base) MCG/ACT inhaler ?Commonly known as: VENTOLIN HFA ?Inhale 2 puffs into the lungs every 4 (four) hours as needed for wheezing or shortness of breath. ?  ?aspirin 81 MG tablet ?Take 1 tablet (81 mg total) by mouth daily. ?  ?atorvastatin 40 MG tablet ?Commonly known as: LIPITOR ?TAKE 1 TABLET EVERY DAY ?What changed: when to take this ?  ?Flovent HFA 110 MCG/ACT inhaler ?Generic drug: fluticasone ?Inhale 2 puffs into the lungs 2 (two) times daily. ?  ?ibuprofen 200 MG tablet ?Commonly known as: ADVIL ?Take 200 mg by mouth every 6 (six) hours as needed for headache or moderate pain. ?  ?levothyroxine 88 MCG tablet ?Commonly known  as: SYNTHROID ?TAKE 1 TABLET EVERY DAY ?What changed: when to take this ?  ?losartan-hydrochlorothiazide 50-12.5 MG tablet ?Commonly known as: HYZAAR ?Take 1 tablet by mouth daily. ?Start taking on: Aug 30, 2021 ?What changed: These instructions start on Aug 30, 2021. If you are unsure what to do until then, ask your doctor or other care  provider. ?  ?metFORMIN 500 MG tablet ?Commonly known as: GLUCOPHAGE ?Take 1 tablet (500 mg total) by mouth 2 (two) times daily with a meal. ?  ?pantoprazole 40 MG tablet ?Commonly known as: PROTONIX ?TAKE 1 TABLET EVERY DAY ?  ?REFRESH OP ?Place 1 drop into both eyes 2 (two) times daily as needed (dry eyes). ?  ?sodium chloride 0.65 % Soln nasal spray ?Commonly known as: OCEAN ?Place 1 spray into both nostrils as needed for congestion. ?  ?vitamin B-12 1000 MCG tablet ?Commonly known as: CYANOCOBALAMIN ?Take 1,000 mcg by mouth daily. ?  ? ?  ? ? ?Allergies  ?Allergen Reactions  ? Actonel [Risedronate Sodium] Other (See Comments)  ?  Chest Pain/Severe GERD  ? ? ?Consultations: ?GI next ? ? ?Procedures/Studies: ?DG Chest 2 View ? ?Result Date: 08/22/2021 ?CLINICAL DATA:  Chest pain EXAM: CHEST - 2 VIEW COMPARISON:  08/15/2014 FINDINGS: Frontal and lateral views of the chest demonstrate an unremarkable cardiac silhouette. No airspace disease, effusion, or pneumothorax. 7 mm lingular nodules unchanged since CTs dating to 2014, benign. No acute bony abnormalities. IMPRESSION: 1. No acute intrathoracic process. Electronically Signed   By: Randa Ngo M.D.   On: 08/22/2021 18:41  ? ?MR 3D Recon At Scanner ? ?Result Date: 08/23/2021 ?CLINICAL DATA:  Choledocholithiasis on CT angiography from earlier today. Cholecystectomy. EXAM: MRI ABDOMEN WITHOUT AND WITH CONTRAST (INCLUDING MRCP) TECHNIQUE: Multiplanar multisequence MR imaging of the abdomen was performed both before and after the administration of intravenous contrast. Heavily T2-weighted images of the biliary and pancreatic ducts were obtained, and three-dimensional MRCP images were rendered by post processing. CONTRAST:  36m GADAVIST GADOBUTROL 1 MMOL/ML IV SOLN COMPARISON:  08/23/2021 CT angiogram of the chest, abdomen and pelvis. FINDINGS: Lower chest: Left upper lobe 7 mm pulmonary nodule (series 11/image 95), unchanged from CT from earlier today. Hepatobiliary:  Normal liver size and configuration. Marked diffuse hepatic steatosis. Three scattered right liver hemangiomas with progressive discontinuous peripheral nodular enhancement, largest 1.5 cm in the segment 7 right liver (series 4/image 111). Additional scattered subcentimeter simple liver cysts. No suspicious liver masses. Status post cholecystectomy. Bile ducts are normal in caliber post cholecystectomy. Common bile duct diameter 5 mm. No intrahepatic biliary ductal dilatation. There is a 9 x 3 mm low signal intensity ovoid filling defect in the lower third of the CBD on the MRCP sequence (series 6/image 64), correlating with focal calcification on the CT study from earlier today, compatible with a choledocholith. No enhancing biliary masses. No biliary beading. Pancreas: Unilocular 1.3 cm uncinate process pancreatic cyst (series 2/image 16) without wall thickening, thickened internal septations or solid enhancement. No additional pancreatic lesions. No pancreatic duct dilation. No pancreas divisum. Spleen: Normal size. No mass. Adrenals/Urinary Tract: Right adrenal 1.7 cm nodule and left adrenal 1.7 cm nodule, both demonstrating prominent loss of signal intensity on out of phase chemical shift imaging, diagnostic of benign bilateral adrenal adenomas. No hydronephrosis. Benign bilateral simple renal cortical cysts, largest 1.1 cm in the anterior interpolar left kidney, requiring no follow-up. Scattered small bilateral T1 hyperintense renal cortical lesions demonstrating no appreciable enhancement on the postcontrast subtraction sequences, largest 0.9  cm in the posterior upper left kidney (series 1000/image 63), compatible with benign hemorrhagic/proteinaceous Bosniak category 2 renal cortical cysts, requiring no follow-up. No suspicious renal cortical masses. Stomach/Bowel: Normal non-distended stomach. Visualized small and large bowel is normal caliber, with no bowel wall thickening. Moderate left colonic  diverticulosis. Vascular/Lymphatic: Atherosclerotic nonaneurysmal abdominal aorta. Patent portal, splenic, hepatic and renal veins. No pathologically enlarged lymph nodes in the abdomen. Other: No abdominal ascites o

## 2021-08-25 NOTE — Progress Notes (Signed)
Pt discharged home. AVS printed out and educational teaching completed with teach back method. Pt has all belongings. No further questions at this time. VSS. PIV removed. ?

## 2021-08-25 NOTE — Progress Notes (Signed)
? ? Progress Note ? ? Subjective  ?Chief Complaint: Choledocholithiasis ? ?This morning, patient is sitting up on the side of her bed and eating her clear diet.  She tells me she is doing really well with no abdominal pain, she has passed a stool and passed some gas and has no nausea or vomiting.  Eating does not give her any additional symptoms.  Her daughter is with her and asked when she may be able to go home. ? ? Objective  ? ?Vital signs in last 24 hours: ?Temp:  [97.9 ?F (36.6 ?C)-98.4 ?F (36.9 ?C)] 98.4 ?F (36.9 ?C) (05/17 5102) ?Pulse Rate:  [69-75] 75 (05/17 0552) ?Resp:  [18-20] 18 (05/17 0552) ?BP: (124-129)/(68-72) 129/72 (05/17 5852) ?SpO2:  [93 %-95 %] 95 % (05/17 0826) ?Last BM Date : 08/21/21 ?General:    Elderly white female in NAD ?Heart:  Regular rate and rhythm; no murmurs ?Lungs: Respirations even and unlabored, lungs CTA bilaterally ?Abdomen:  Soft, nontender and nondistended. Normal bowel sounds.Marland Kitchen ?Psych:  Cooperative. Normal mood and affect. ? ?Intake/Output from previous day: ?05/16 0701 - 05/17 0700 ?In: 640 [P.O.:240; I.V.:300; IV Piggyback:100] ?Out: -  ? ? ?Lab Results: ?Recent Labs  ?  08/22/21 ?1813 08/24/21 ?7782 08/25/21 ?4235  ?WBC 12.5* 6.7 12.4*  ?HGB 14.1 13.1 12.7  ?HCT 42.8 39.9 38.5  ?PLT 259 187 184  ? ?BMET ?Recent Labs  ?  08/22/21 ?1813 08/24/21 ?3614 08/25/21 ?4315  ?NA 135 140 140  ?K 3.8 3.6 3.6  ?CL 98 106 107  ?CO2 '25 25 26  '$ ?GLUCOSE 156* 89 107*  ?BUN '21 15 12  '$ ?CREATININE 1.42* 0.89 0.88  ?CALCIUM 9.7 8.6* 8.7*  ? ?LFT ?Recent Labs  ?  08/23/21 ?4008 08/24/21 ?6761 08/25/21 ?9509  ?PROT 6.5   < > 6.0*  ?ALBUMIN 3.7   < > 3.4*  ?AST 448*   < > 137*  ?ALT 468*   < > 357*  ?ALKPHOS 196*   < > 183*  ?BILITOT 3.9*   < > 1.5*  ?BILIDIR 2.0*  --   --   ?IBILI 1.9*  --   --   ? < > = values in this interval not displayed.  ? ?Studies/Results: ?MR 3D Recon At Scanner ? ?Result Date: 08/23/2021 ?CLINICAL DATA:  Choledocholithiasis on CT angiography from earlier today.  Cholecystectomy. EXAM: MRI ABDOMEN WITHOUT AND WITH CONTRAST (INCLUDING MRCP) TECHNIQUE: Multiplanar multisequence MR imaging of the abdomen was performed both before and after the administration of intravenous contrast. Heavily T2-weighted images of the biliary and pancreatic ducts were obtained, and three-dimensional MRCP images were rendered by post processing. CONTRAST:  37m GADAVIST GADOBUTROL 1 MMOL/ML IV SOLN COMPARISON:  08/23/2021 CT angiogram of the chest, abdomen and pelvis. FINDINGS: Lower chest: Left upper lobe 7 mm pulmonary nodule (series 11/image 95), unchanged from CT from earlier today. Hepatobiliary: Normal liver size and configuration. Marked diffuse hepatic steatosis. Three scattered right liver hemangiomas with progressive discontinuous peripheral nodular enhancement, largest 1.5 cm in the segment 7 right liver (series 4/image 111). Additional scattered subcentimeter simple liver cysts. No suspicious liver masses. Status post cholecystectomy. Bile ducts are normal in caliber post cholecystectomy. Common bile duct diameter 5 mm. No intrahepatic biliary ductal dilatation. There is a 9 x 3 mm low signal intensity ovoid filling defect in the lower third of the CBD on the MRCP sequence (series 6/image 64), correlating with focal calcification on the CT study from earlier today, compatible with a choledocholith. No enhancing  biliary masses. No biliary beading. Pancreas: Unilocular 1.3 cm uncinate process pancreatic cyst (series 2/image 16) without wall thickening, thickened internal septations or solid enhancement. No additional pancreatic lesions. No pancreatic duct dilation. No pancreas divisum. Spleen: Normal size. No mass. Adrenals/Urinary Tract: Right adrenal 1.7 cm nodule and left adrenal 1.7 cm nodule, both demonstrating prominent loss of signal intensity on out of phase chemical shift imaging, diagnostic of benign bilateral adrenal adenomas. No hydronephrosis. Benign bilateral simple renal  cortical cysts, largest 1.1 cm in the anterior interpolar left kidney, requiring no follow-up. Scattered small bilateral T1 hyperintense renal cortical lesions demonstrating no appreciable enhancement on the postcontrast subtraction sequences, largest 0.9 cm in the posterior upper left kidney (series 1000/image 63), compatible with benign hemorrhagic/proteinaceous Bosniak category 2 renal cortical cysts, requiring no follow-up. No suspicious renal cortical masses. Stomach/Bowel: Normal non-distended stomach. Visualized small and large bowel is normal caliber, with no bowel wall thickening. Moderate left colonic diverticulosis. Vascular/Lymphatic: Atherosclerotic nonaneurysmal abdominal aorta. Patent portal, splenic, hepatic and renal veins. No pathologically enlarged lymph nodes in the abdomen. Other: No abdominal ascites or focal fluid collection. Partial visualization of abnormal bilateral heterogeneous adnexal masses (series 2/image 11). Musculoskeletal: No aggressive appearing focal osseous lesions. IMPRESSION: 1. Solitary 9 x 3 mm choledocholith in the lower third of the CBD. No intrahepatic or extrahepatic biliary ductal dilatation. CBD diameter 5 mm. 2. Partial visualization of abnormal bilateral adnexal masses, better seen on CT angiogram study from earlier today. Ovarian neoplasm not excluded. Pelvic ultrasound correlation recommended on a short term outpatient basis. 3. Unilocular 1.3 cm uncinate process pancreatic cyst without suspicious MRI features. Recommend attention on follow-up MRI abdomen without and with contrast in two years. This recommendation follows ACR consensus guidelines: Management of Incidental Pancreatic Cysts: A White Paper of the ACR Incidental Findings Committee. J Am Coll Radiol 6333;54:562-563. 4. Marked diffuse hepatic steatosis. 5. Bilateral benign adrenal adenomas, for which no follow-up is recommended. 6. Left upper lobe 7 mm pulmonary nodule, please see the chest CT angiogram  report from earlier today for follow-up recommendations. Electronically Signed   By: Ilona Sorrel M.D.   On: 08/23/2021 15:12  ? ?DG ERCP ? ?Result Date: 08/24/2021 ?CLINICAL DATA:  Choledocholithiasis EXAM: ERCP TECHNIQUE: Multiple spot images obtained with the fluoroscopic device and submitted for interpretation post-procedure. FLUOROSCOPY: Radiation Exposure Index (as provided by the fluoroscopic device): 55.3 mGy Kerma COMPARISON:  None Available. FINDINGS: A total of 12 intraoperative saved images are submitted for review. The images demonstrate a flexible duodenal scope in the descending duodenum with wire cannulation of the common bile duct. Initial cholangiogram images demonstrate filling defects in the distal common bile duct consistent with choledocholithiasis. Subsequent images demonstrate sphincterotomy and balloon sweeping of the common duct. IMPRESSION: 1. Choledocholithiasis. 2. ERCP with sphincterotomy and balloon sweeping of the common duct. These images were submitted for radiologic interpretation only. Please see the procedural report for the amount of contrast and the fluoroscopy time utilized. Electronically Signed   By: Jacqulynn Cadet M.D.   On: 08/24/2021 10:10  ? ?MR ABDOMEN MRCP W WO CONTAST ? ?Result Date: 08/23/2021 ?CLINICAL DATA:  Choledocholithiasis on CT angiography from earlier today. Cholecystectomy. EXAM: MRI ABDOMEN WITHOUT AND WITH CONTRAST (INCLUDING MRCP) TECHNIQUE: Multiplanar multisequence MR imaging of the abdomen was performed both before and after the administration of intravenous contrast. Heavily T2-weighted images of the biliary and pancreatic ducts were obtained, and three-dimensional MRCP images were rendered by post processing. CONTRAST:  42m GADAVIST GADOBUTROL 1 MMOL/ML IV  SOLN COMPARISON:  08/23/2021 CT angiogram of the chest, abdomen and pelvis. FINDINGS: Lower chest: Left upper lobe 7 mm pulmonary nodule (series 11/image 95), unchanged from CT from earlier  today. Hepatobiliary: Normal liver size and configuration. Marked diffuse hepatic steatosis. Three scattered right liver hemangiomas with progressive discontinuous peripheral nodular enhancement, largest 1.5 cm in

## 2021-09-28 ENCOUNTER — Ambulatory Visit: Payer: Medicare HMO | Admitting: Gastroenterology

## 2021-10-26 ENCOUNTER — Ambulatory Visit: Payer: Medicare HMO | Admitting: Gastroenterology

## 2021-10-26 ENCOUNTER — Encounter: Payer: Self-pay | Admitting: Gastroenterology

## 2021-10-26 VITALS — BP 118/72 | HR 93 | Ht 63.0 in | Wt 155.0 lb

## 2021-10-26 DIAGNOSIS — Z716 Tobacco abuse counseling: Secondary | ICD-10-CM

## 2021-10-26 DIAGNOSIS — Z86010 Personal history of colonic polyps: Secondary | ICD-10-CM | POA: Diagnosis not present

## 2021-10-26 DIAGNOSIS — R7401 Elevation of levels of liver transaminase levels: Secondary | ICD-10-CM

## 2021-10-26 DIAGNOSIS — K862 Cyst of pancreas: Secondary | ICD-10-CM

## 2021-10-26 DIAGNOSIS — K8050 Calculus of bile duct without cholangitis or cholecystitis without obstruction: Secondary | ICD-10-CM | POA: Diagnosis not present

## 2021-10-26 NOTE — Progress Notes (Signed)
Chief Complaint:    Choledocholithiasis, ERCP follow-up, change in bowel habits  GI History: 76 year old female with a history of diabetes, COPD, GERD c/b erosive esophagitis and Barrett's Esophagus, colon polyps, cholecystectomy 2012, admitted in 08/2021 with choledocholithiasis. - 08/23/2021: CT angio C/A/P: Fatty liver, s/p ccy, few small calcifications in the distal CBD which may represent CDL. - 08/23/2021: MRCP: 9 x 3 mm CDL in lower third of CBD without duct dilation.  CBD 5 mm.  Unilocular 1.3 cm pancreatic cyst in the uncinate process without suspicious MRI features.  Recommend MRI abdomen in 2 years.  Partial visualization of abnormal bilateral adnexal masses.  Recommended pelvic ultrasound for outpatient follow-up. - 08/24/2021: ERCP: 7 mm stone in CBD removed with sphincterotomy and balloon sweeps - 08/25/2021: Pelvic ultrasound: Several cystic structures in the right ovary measuring up to 2 cm.  Ovaries poorly evaluated with transabdominal ultrasound and patient refused TVUS.  Recommend outpatient MRI pelvis - 08/25/2021: Liver enzymes downtrending (AST/ALT 137/357, ALP 183, T. bili 1.5).  Discharged to home  HPI:     Patient is a 76 y.o. female presenting to the Gastroenterology Clinic for hospital follow-up after choledocholithiasis requiring ERCP with stone extraction.  Had f/u with PCM at [facility] on 08/30/21.  Labs from that day reviewed: - Normal CBC - AST/ALT 28/95, ALP 183, Tbili 0.9.   Comparison outpatient labs from 08/18/2021 was normal.   MRI Pelvis ordered by PCM at [facility], completed 10/08/2021: "Abnormal multiloculated cystic changes to both ovaries. Concerning for cystic neoplasm. Very abnormal for age". Has appt to f/u with GYN for abnormal MRI pelvis.   Quit smoking 1.5 months ago! Using nicotine patch for assistance in cessation.   Otherwise good p.o. intake, no abdominal pain, nausea, vomiting.  Normal bowel habits.    Review of systems:     No chest pain, no SOB,  no fevers, no urinary sx   Past Medical History:  Diagnosis Date   BCC (basal cell carcinoma), face    nasolabial fold, s/p MOHS   Cholelithiasis    COPD (chronic obstructive pulmonary disease) ([place])    Diabetes mellitus without complication ([place])    borderline not on meds    Diverticulosis    GERD (gastroesophageal reflux disease)    Barrett's Esophagus   History of colon polyps 2009, 2012   Hyperlipidemia    Hypertension    patient denies at preop visit of 08/07/14, htn noted in cardiology history done 07/2014    Hypothyroidism    Low bone mass 2008   Nephrolithiasis    st[place]   PONV (postoperative nausea and vomiting)    Thyroid disease    Vitamin D insufficiency    04/2009    Patient's surgical history, family medical history, social history, medications and allergies were all reviewed in Epic    Current Outpatient Medications  Medication Sig Dispense Refill   albuterol (PROVENTIL HFA;VENTOLIN HFA) 108 (90 Base) MCG/ACT inhaler Inhale 2 puffs into the lungs every 4 (four) hours as needed for wheezing or shortness of breath. 3 Inhaler 1   aspirin 81 MG tablet Take 1 tablet (81 mg total) by mouth daily. 100 tablet 3   atorvastatin (LIPITOR) 40 MG tablet TAKE 1 TABLET EVERY DAY 90 tablet 3   ibuprofen (ADVIL) 200 MG tablet Take 200 mg by mouth every 6 (six) hours as needed for headache or moderate pain.     levothyroxine (SYNTHROID, LEVOTHROID) 88 MCG tablet TAKE 1 TABLET EVERY DAY 90 tablet 3  losartan-hydrochlorothiazide (HYZAAR) 50-12.5 MG tablet Take 1 tablet by mouth daily.     metFORMIN (GLUCOPHAGE) 500 MG tablet Take 1 tablet (500 mg total) by mouth 2 (two) times daily with a meal. 180 tablet 3   nicotine (NICODERM CQ - DOSED IN MG/24 HOURS) 14 mg/24hr patch Place 14 mg onto the skin daily.     pantoprazole (PROTONIX) 40 MG tablet TAKE 1 TABLET EVERY DAY 90 tablet 3   vitamin B-12 (CYANOCOBALAMIN) 1000 MCG tablet Take 1,000 mcg by mouth daily.     FLOVENT HFA 110  MCG/ACT inhaler Inhale 2 puffs into the lungs 2 (two) times daily. (Patient not taking: Reported on 10/26/2021)     Polyvinyl Alcohol-Povidone (REFRESH OP) Place 1 drop into both eyes 2 (two) times daily as needed (dry eyes). (Patient not taking: Reported on 10/26/2021)     sodium chloride (OCEAN) 0.65 % SOLN nasal spray Place 1 spray into both nostrils as needed for congestion. (Patient not taking: Reported on 10/26/2021)     No current facility-administered medications for this visit.    Physical Exam:     BP 118/72   Pulse 93   Ht '5\' 3"'$  (1.6 m)   Wt 155 lb (70.3 kg)   BMI 27.46 kg/m   GENERAL:  Pleasant female in NAD PSYCH: : Cooperative, normal affect Musculoskeletal:  Normal muscle tone, normal strength NEURO: Alert and oriented x 3, no focal neurologic deficits   IMPRESSION and PLAN:    1) Choledocholithiasis - s/p ERCP 08/2021 with sphincterotomy and stone extraction.  No recurrence of index symptoms - Liver enzymes downtrending on follow-up with PCM on 08/30/2021.  Previous baseline enzymes were normal 2 weeks prior to hospital admission - Repeat liver enzymes in August (3-monthrepeat).  This can be done at the time for follow-up with her PCM scheduled for 11/23/2021  2) Pancreatic cyst - MRCP with incidentally noted 1.3 cm pancreatic cyst in the uncinate process without suspicious features - Repeat MRI abdomen-Pancreas protocol in 2 years (2025)     3) Ovarian cyst -Follow-up with GYN at appointment scheduled for next month for eval of abnormal Pelvic ultrasound and subsequent MRI pelvis  4) History of colon polyps - Last colonoscopy was 2012 and notable for 10 hyperplastic polyps and sigmoid diverticulosis.  Prior to that, multiple adenomas and SSP on colonoscopy in 2009 - Can potentially plan for repeat colonoscopy for ongoing screening after she is evaluated in the GYN clinic.  I think evaluation of ovarian cyst takes precedence at this juncture  5) History of tobacco  use - Congratulated her on smoking cessation!  She has been without cigarettes for the last 1.5 months  Can otherwise RTC in 2 years for review of MRI Pancreas, or sooner prn  I spent 30 minutes of time, including in depth chart review, independent review of results as outlined above, communicating results with the patient directly, face-to-face time with the patient, coordinating care, and ordering studies and medications as appropriate, and documentation.       [place],DO, FACG 10/26/2021, 10:00 AM

## 2021-10-26 NOTE — Patient Instructions (Addendum)
If you are age 77 or older, your body mass index should be between 23-30. Your Body mass index is 27.46 kg/m. If this is out of the aforementioned range listed, please consider follow up with your Primary Care Provider.  ________________________________________________________  The [facility] providers would like to encourage you to use [facility] to communicate with providers for non-urgent requests or questions.  Due to long hold times on the telephone, sending your provider a message by [facility] may be a faster and more efficient way to get a response.  Please allow 48 business hours for a response.  Please remember that this is for non-urgent requests.  _______________________________________________________  Due to recent changes in healthcare laws, you may see the results of your imaging and laboratory studies on MyChart before your provider has had a chance to review them.  We understand that in some cases there may be results that are confusing or concerning to you. Not all laboratory results come back in the same time frame and the provider may be waiting for multiple results in order to interpret others.  Please give [place] 48 hours in order for your provider to thoroughly review all the results before contacting the office for clarification of your results.   Please follow up in 2 years. Give [place] a call at [phone] to schedule an appointment.   We will call you in 2 years to schedule your MRI.   Thank you for choosing me and [facility].  Vito Cirigliano, D.O.

## 2022-04-14 ENCOUNTER — Telehealth: Payer: Self-pay | Admitting: *Deleted

## 2022-04-14 NOTE — Telephone Encounter (Signed)
error 

## 2022-08-22 IMAGING — MR MR ABDOMEN WO/W CM MRCP
12 of 21 series · 20 of 48 positions shown · IV contrast (gadavist)
Comparison: 08/23/2021 CT angiogram of the chest, abdomen and
pelvis.

CLINICAL DATA: Choledocholithiasis on CT angiography from earlier
today. Cholecystectomy.

EXAM:
MRI ABDOMEN WITHOUT AND WITH CONTRAST (INCLUDING MRCP)
TECHNIQUE: Multiplanar multisequence MR imaging of the abdomen was performed
both before and after the administration of intravenous contrast.
Heavily T2-weighted images of the biliary and pancreatic ducts were
obtained, and three-dimensional MRCP images were rendered by post
processing.
CONTRAST:  6mL GADAVIST GADOBUTROL 1 MMOL/ML IV SOLN

[Series 2: cor ssfse nav · coronal · 6.0mm · 0.78mm/px · 1 of 31 slices shown]
[im 1/31]
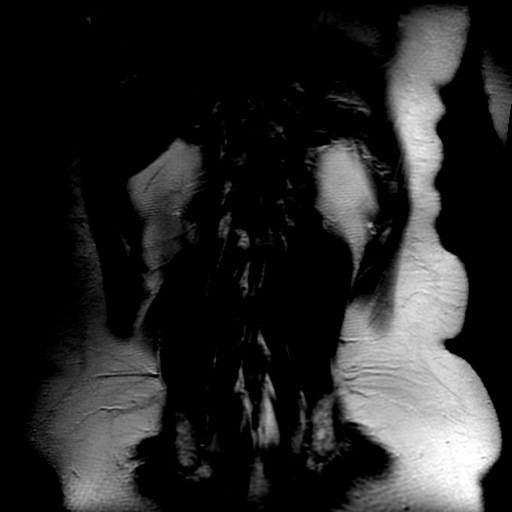

[Series 3: ax ssfse nav · axial · 6.0mm · 0.74mm/px · 1 of 37 slices shown]
[im 1/37]
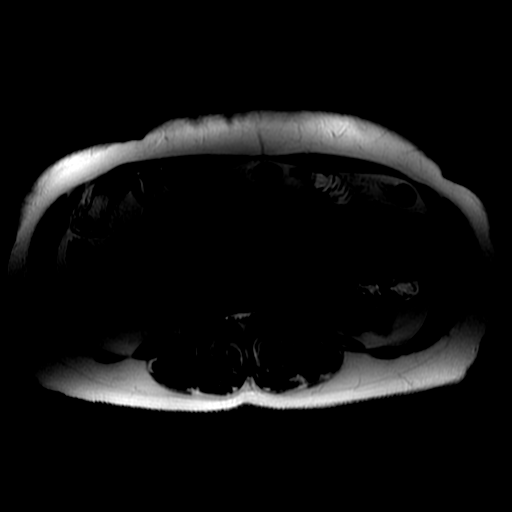

[Series 4: T2 fat-sat · axial · 6.0mm · 0.74mm/px · 1 of 34 slices shown]
[im 1/34]
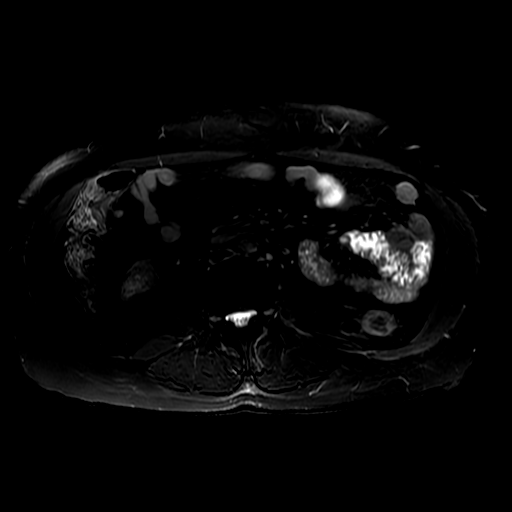

[Series 5: DWI b500 · axial · 8.0mm · 1.48mm/px · 1 of 64 slices shown]
[im 1/64]
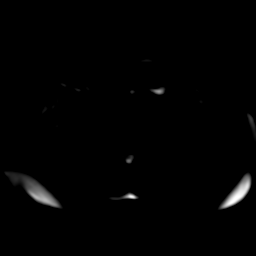

[Series 7: T1 dynamic · axial · 5.0mm · 0.82mm/px · z∈[-94,+184]mm · 3 of 112 slices shown (1 of 4)]
[im 1/112]
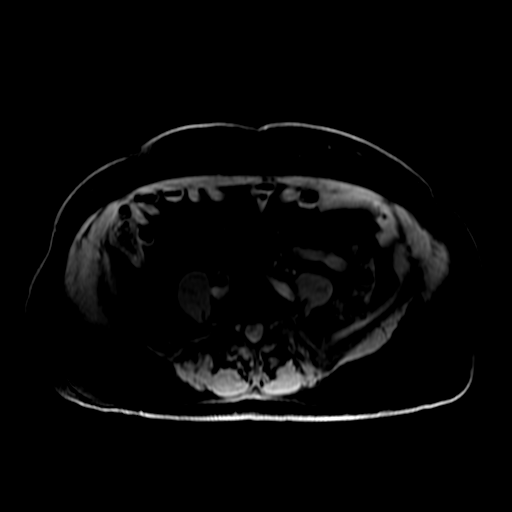
[im 56/112]
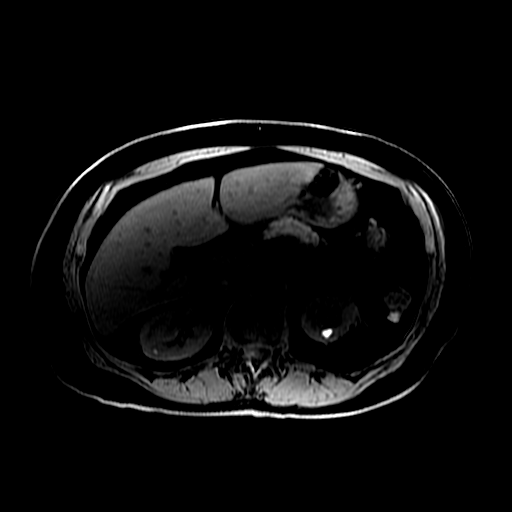
[im 112/112]
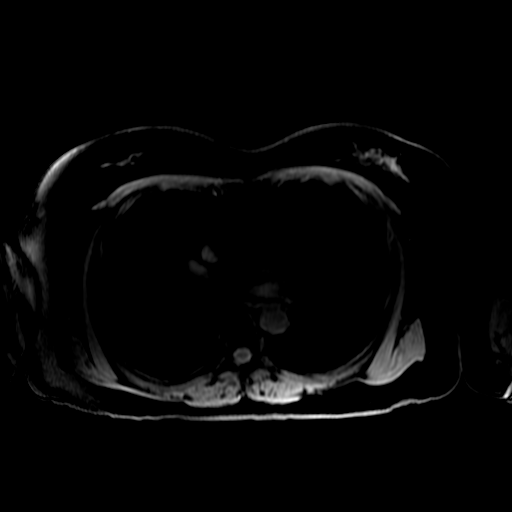

[Series 8: radial 2d thick · coronal · 40.0mm · 0.86mm/px · 1 of 6 slices shown]
[im 1/6]
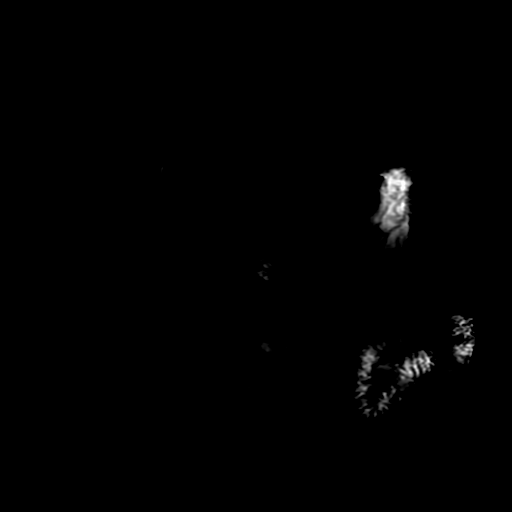

[Series 9: bSSFP · coronal · 6.0mm · 0.78mm/px · 1 of 32 slices shown]
[im 1/32]
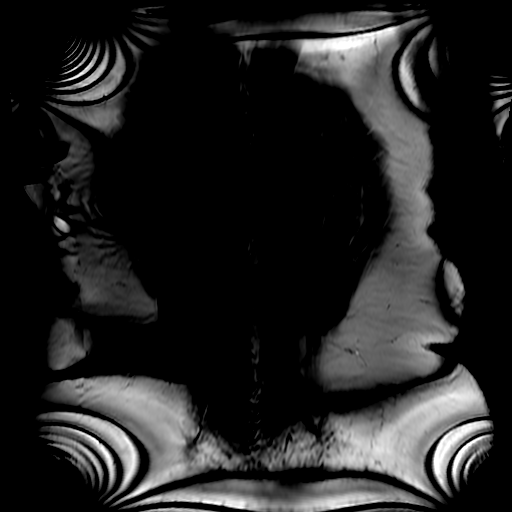

[Series 11: T1 dynamic · coronal · 3.4mm · 1.56mm/px · 3 of 120 slices shown (2 of 4)]
[im 1/120]
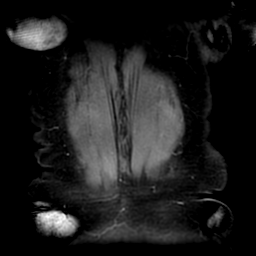
[im 60/120]
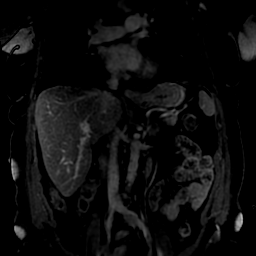
[im 120/120]
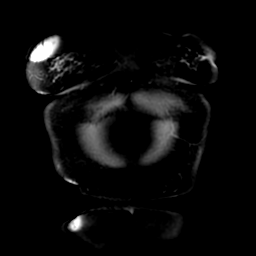

[Series 550: ADC · axial · 8.0mm · 1.48mm/px · 1 of 32 slices shown]
[im 1/32]
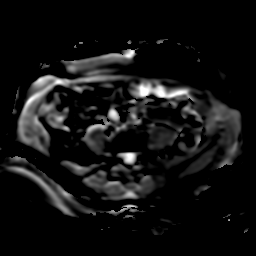

[Series 701: T1 dynamic · axial · 5.0mm · 0.82mm/px · z∈[-94,+184]mm · 3 of 112 slices shown (3 of 4)]
[im 1/112]
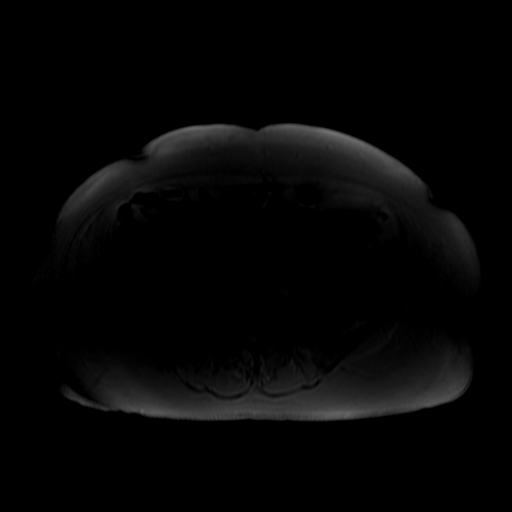
[im 56/112]
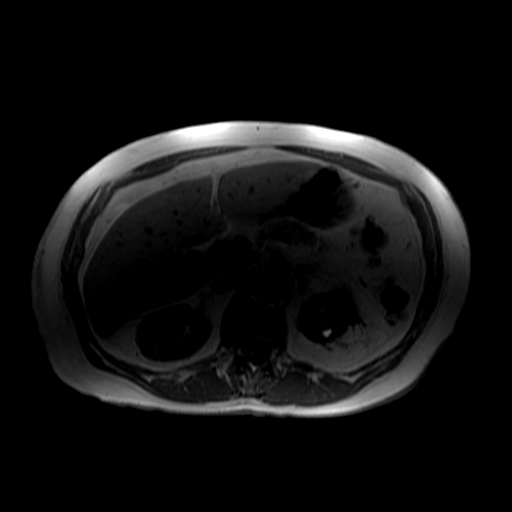
[im 112/112]
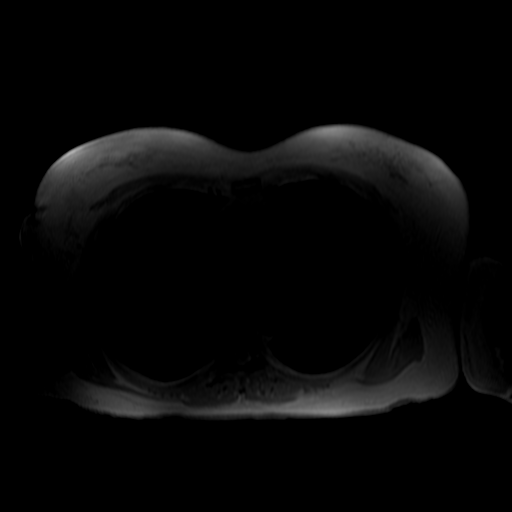

[Series 702: T1 dynamic · axial · 5.0mm · 0.82mm/px · z∈[-94,+184]mm · 3 of 112 slices shown (4 of 4)]
[im 1/112]
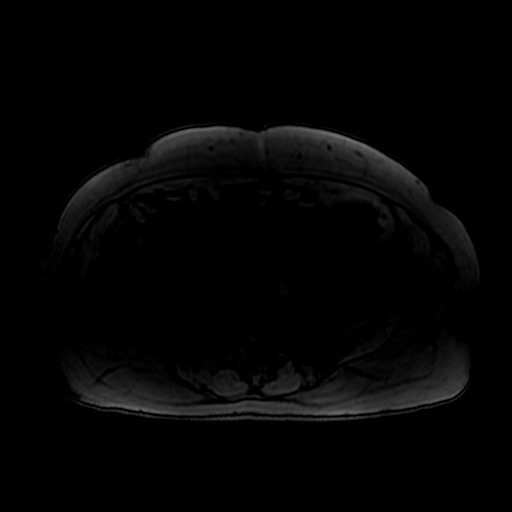
[im 56/112]
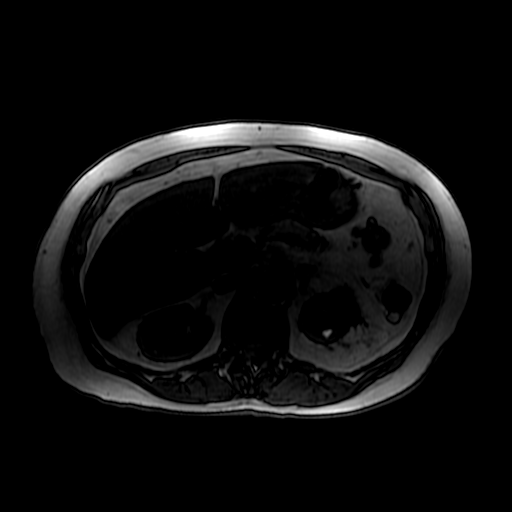
[im 112/112]
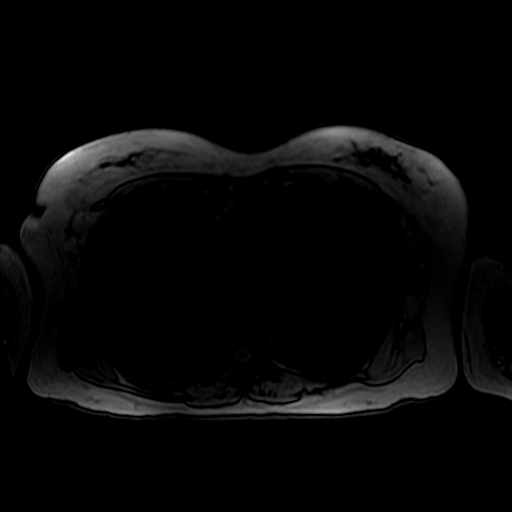

[Series 1000: T1 dynamic post-contrast · axial · non-contrast · 4.0mm · 0.86mm/px · 1 of 116 slices shown]
[im 1/116]
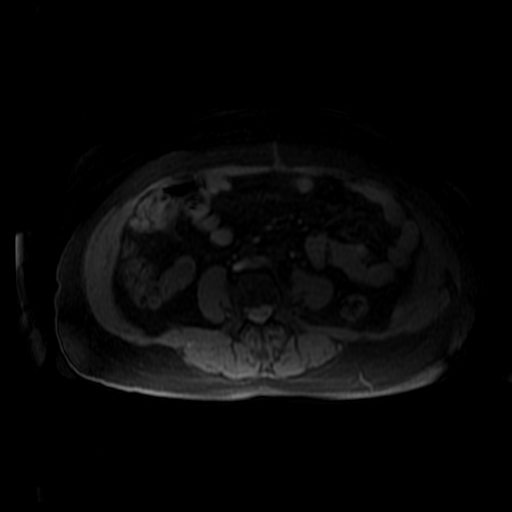

[20 of 48 positions shown; findings below may reference images not displayed]

FINDINGS: Lower chest: Left upper lobe 7 mm pulmonary nodule (series 11/image
95), unchanged from CT from earlier today.

Hepatobiliary: Normal liver size and configuration. Marked diffuse
hepatic steatosis. Three scattered right liver hemangiomas with
progressive discontinuous peripheral nodular enhancement, largest
1.5 cm in the segment 7 right liver (series 4/image 111). Additional
scattered subcentimeter simple liver cysts. No suspicious liver
masses. Status post cholecystectomy. Bile ducts are normal in
caliber post cholecystectomy. Common bile duct diameter 5 mm. No
intrahepatic biliary ductal dilatation. There is a 9 x 3 mm low
signal intensity ovoid filling defect in the lower third of the CBD
on the MRCP sequence (series 6/image 64), correlating with focal
calcification on the CT study from earlier today, compatible with a
choledocholith. No enhancing biliary masses. No biliary beading.

Pancreas: Unilocular 1.3 cm uncinate process pancreatic cyst (series
2/image 16) without wall thickening, thickened internal septations
or solid enhancement. No additional pancreatic lesions. No
pancreatic duct dilation. No pancreas divisum.

Spleen: Normal size. No mass.

Adrenals/Urinary Tract: Right adrenal 1.7 cm nodule and left adrenal
1.7 cm nodule, both demonstrating prominent loss of signal intensity
on out of phase chemical shift imaging, diagnostic of benign
bilateral adrenal adenomas. No hydronephrosis. Benign bilateral
simple renal cortical cysts, largest 1.1 cm in the anterior
interpolar left kidney, requiring no follow-up. Scattered small
bilateral T1 hyperintense renal cortical lesions demonstrating no
appreciable enhancement on the postcontrast subtraction sequences,
largest 0.9 cm in the posterior upper left kidney (series 5777/image
63), compatible with benign hemorrhagic/proteinaceous Bosniak
category 2 renal cortical cysts, requiring no follow-up. No
suspicious renal cortical masses.

Stomach/Bowel: Normal non-distended stomach. Visualized small and
large bowel is normal caliber, with no bowel wall thickening.
Moderate left colonic diverticulosis.

Vascular/Lymphatic: Atherosclerotic nonaneurysmal abdominal aorta.
Patent portal, splenic, hepatic and renal veins. No pathologically
enlarged lymph nodes in the abdomen.

Other: No abdominal ascites or focal fluid collection. Partial
visualization of abnormal bilateral heterogeneous adnexal masses
(series 2/image 11).

Musculoskeletal: No aggressive appearing focal osseous lesions.
IMPRESSION: 1. Solitary 9 x 3 mm choledocholith in the lower third of the CBD.
No intrahepatic or extrahepatic biliary ductal dilatation. CBD
diameter 5 mm.
2. Partial visualization of abnormal bilateral adnexal masses,
better seen on CT angiogram study from earlier today. Ovarian
neoplasm not excluded. Pelvic ultrasound correlation recommended on
a short term outpatient basis.
3. Unilocular 1.3 cm uncinate process pancreatic cyst without
suspicious MRI features. Recommend attention on follow-up MRI
abdomen without and with contrast in two years. This recommendation
follows ACR consensus guidelines: Management of Incidental
Pancreatic Cysts: A White Paper of the ACR Incidental Findings
Committee. [HOSPITAL] [id number];[DATE].
4. Marked diffuse hepatic steatosis.
5. Bilateral benign adrenal adenomas, for which no follow-up is
recommended.
6. Left upper lobe 7 mm pulmonary nodule, please see the chest CT
angiogram report from earlier today for follow-up recommendations.

## 2022-08-24 IMAGING — US US PELVIS COMPLETE
1 series · 13 of 25 positions shown · non-contrast
Comparison: None Available.

CLINICAL DATA: Prominent ovaries on CT exam. Patient refused trans
vaginal evaluation

EXAM:
TRANSABDOMINAL ULTRASOUND OF PELVIS
DOPPLER ULTRASOUND OF OVARIES
TECHNIQUE: Transabdominal ultrasound examination of the pelvis was performed
including evaluation of the uterus, ovaries, adnexal regions, and
pelvic cul-de-sac.
Color and duplex Doppler ultrasound was utilized to evaluate blood
flow to the ovaries.

[Series 1: us pelvic complete w transvaginal and torsion righ · 13 of 71 slices shown]
[im 1/71]
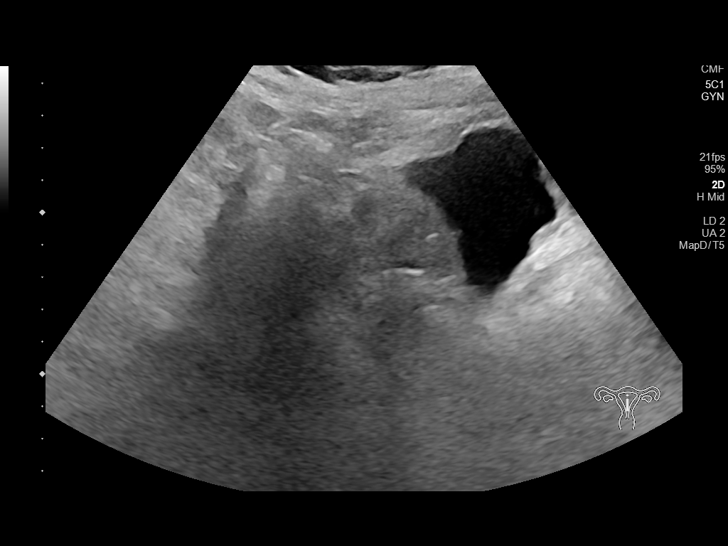
[im 6/71]
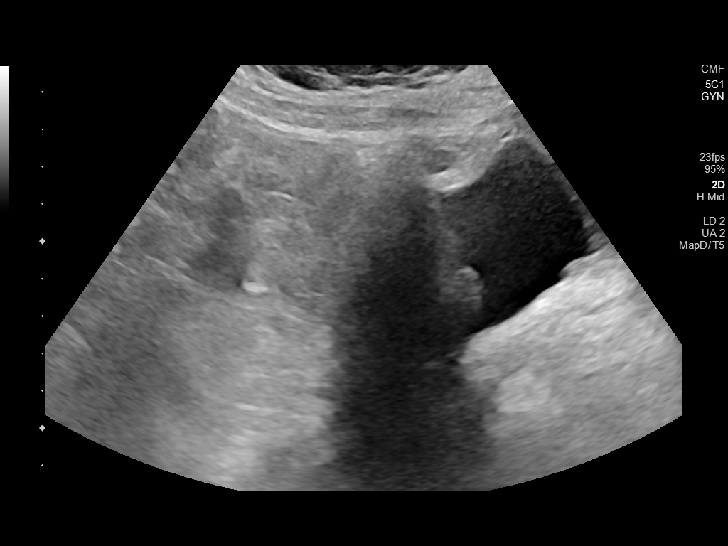
[im 12/71]
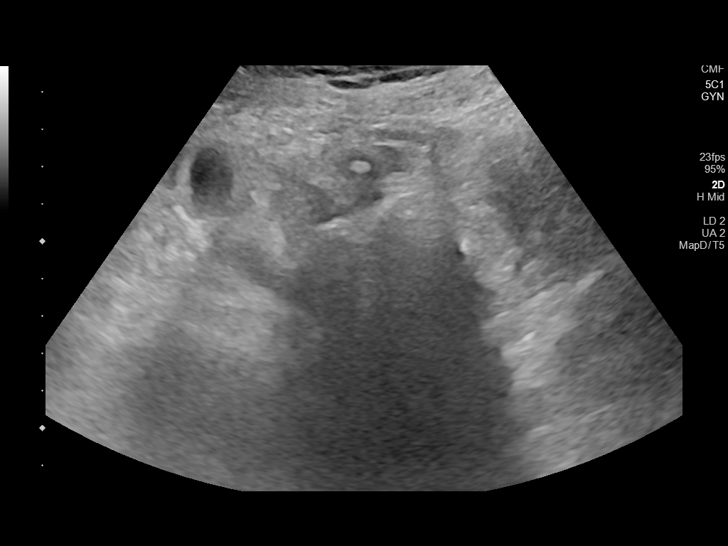
[im 18/71]
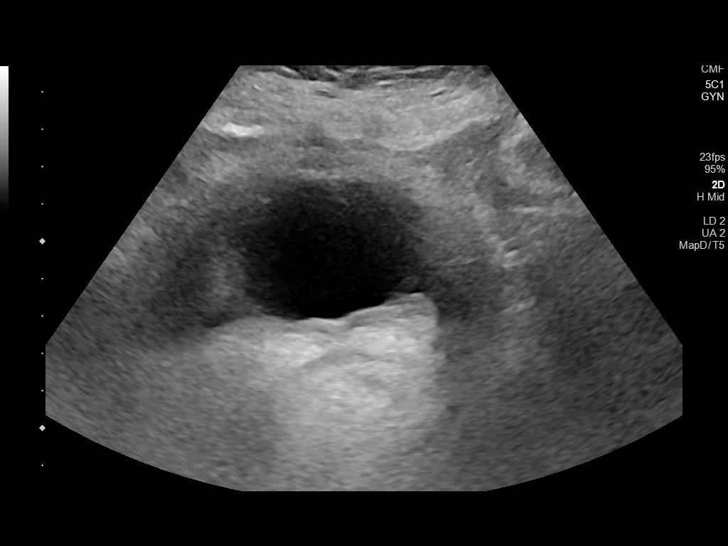
[im 24/71]
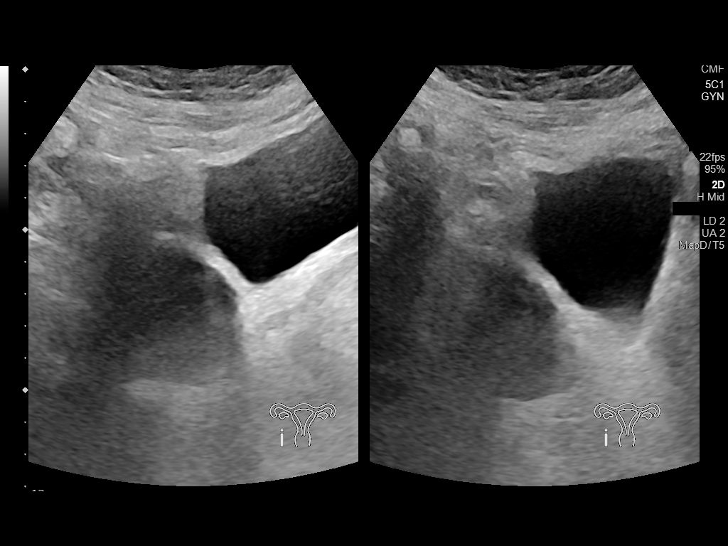
[im 30/71]
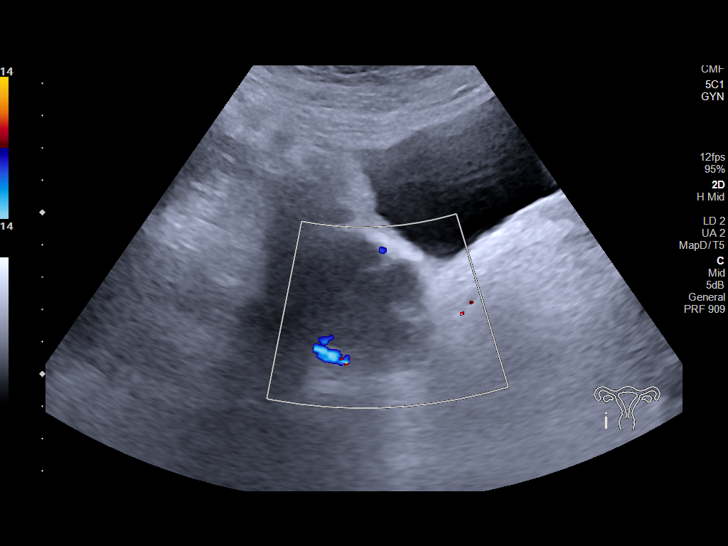
[im 36/71]
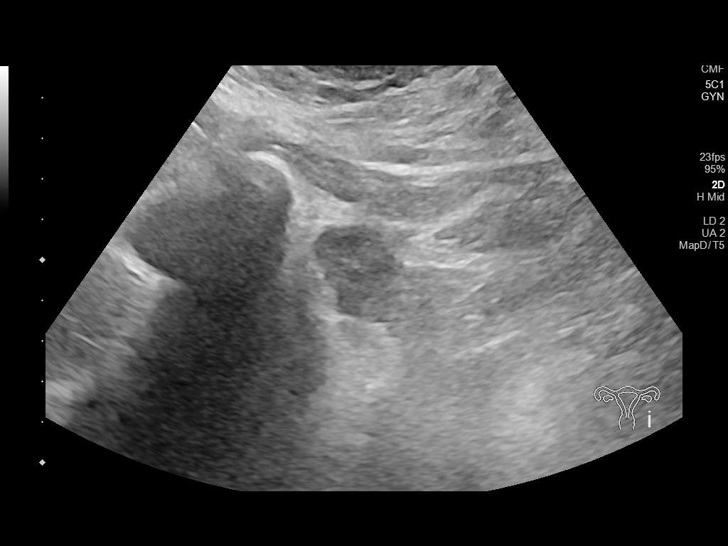
[im 41/71]
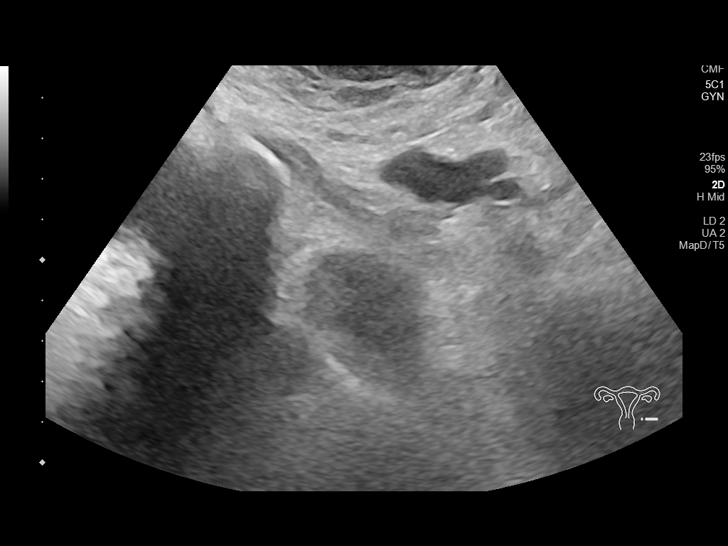
[im 47/71]
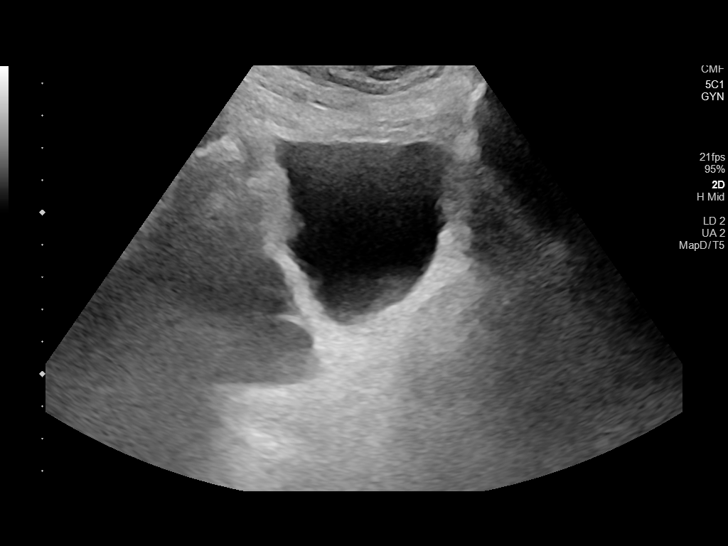
[im 53/71]
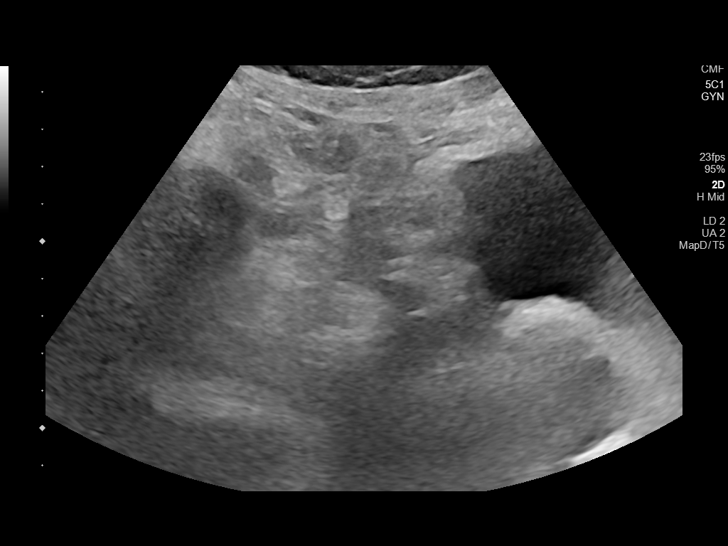
[im 59/71]
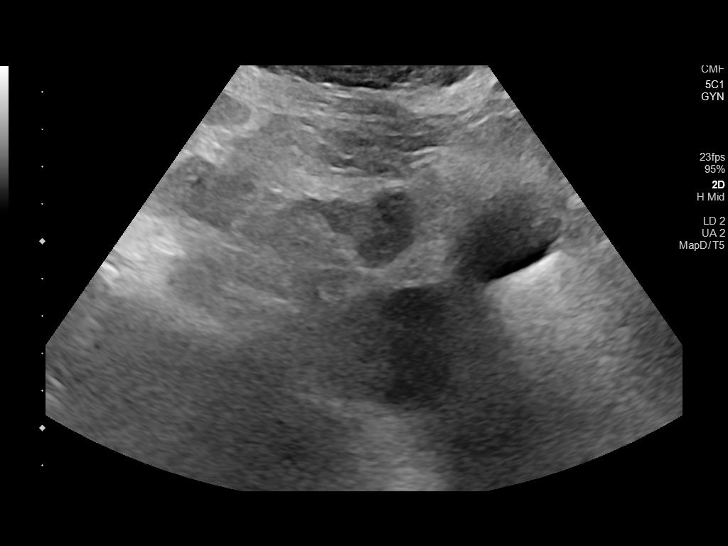
[im 65/71]
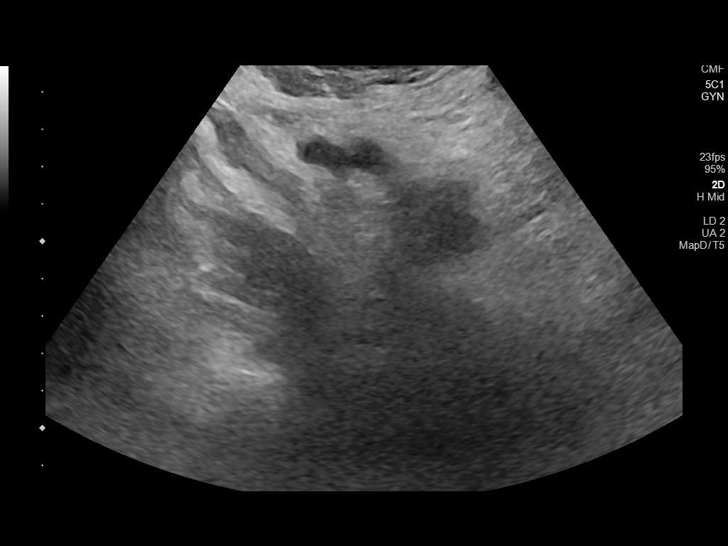
[im 71/71]
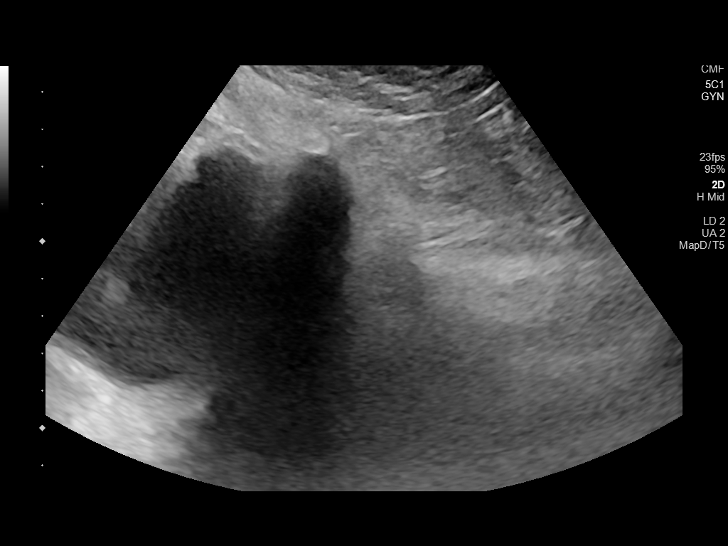

[13 of 25 positions shown; findings below may reference images not displayed]

FINDINGS: Uterus

Measurements: Surgically absent

Endometrium

Thickness: Surgically absent

Right ovary

Measurements: 4.3 by 4.1 x 3.2 cm = volume: 29.4 mL. Several cystic
structures within the RIGHT ovary measure up to 2 cm. Ovary poorly
evaluated on transabdominal exam.

Left ovary

Measurements: 3.6 x 2.4 x 1.9 cm = volume: 8.5 mL. Normal
appearance.

Pulsed Doppler evaluation demonstrates normal low-resistance
arterial and venous waveforms in both ovaries.

Other: No free fluid
IMPRESSION: 1. Ovaries poorly evaluated by transabdominal ultrasound. Patient
refused transvaginal ultrasound. Mild cystic enlargement the RIGHT
ovary. Cystic enlargement of the LEFT ovary appreciated on
comparison CT not appreciated by transabdominal ultrasound.
2. Given patient's age, consider follow-up non emergent MRI of the
pelvis with contrast to exclude ovarian cystic neoplasm.
